# Patient Record
Sex: Male | Born: 1985
Health system: Southern US, Community
[De-identification: ages and names within clinical notes are randomized; demographics above are authoritative.]

## PROBLEM LIST (undated history)

## (undated) DIAGNOSIS — S82402A Unspecified fracture of shaft of left fibula, initial encounter for closed fracture: Secondary | ICD-10-CM

## (undated) DIAGNOSIS — Z8619 Personal history of other infectious and parasitic diseases: Secondary | ICD-10-CM

## (undated) DIAGNOSIS — N200 Calculus of kidney: Secondary | ICD-10-CM

## (undated) HISTORY — DX: Calculus of kidney: N20.0

## (undated) HISTORY — DX: Personal history of other infectious and parasitic diseases: Z86.19

## (undated) HISTORY — DX: Unspecified fracture of shaft of left fibula, initial encounter for closed fracture: S82.402A

---
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
## 2006-04-21 ENCOUNTER — Emergency Department (HOSPITAL_COMMUNITY): Admission: EM | Admit: 2006-04-21 | Discharge: 2006-04-21 | Payer: Self-pay | Admitting: Emergency Medicine

## 2006-04-22 ENCOUNTER — Emergency Department (HOSPITAL_COMMUNITY): Admission: EM | Admit: 2006-04-22 | Discharge: 2006-04-22 | Payer: Self-pay | Admitting: Emergency Medicine

## 2007-05-23 HISTORY — PX: APPENDECTOMY: SHX54

## 2007-10-21 ENCOUNTER — Ambulatory Visit (HOSPITAL_COMMUNITY): Admission: EM | Admit: 2007-10-21 | Discharge: 2007-10-22 | Payer: Self-pay | Admitting: Emergency Medicine

## 2007-10-21 ENCOUNTER — Encounter (INDEPENDENT_AMBULATORY_CARE_PROVIDER_SITE_OTHER): Payer: Self-pay | Admitting: *Deleted

## 2007-11-08 ENCOUNTER — Emergency Department (HOSPITAL_COMMUNITY): Admission: EM | Admit: 2007-11-08 | Discharge: 2007-11-09 | Payer: Self-pay | Admitting: Emergency Medicine

## 2007-11-20 ENCOUNTER — Emergency Department (HOSPITAL_COMMUNITY): Admission: EM | Admit: 2007-11-20 | Discharge: 2007-11-20 | Payer: Self-pay | Admitting: Family Medicine

## 2008-08-13 ENCOUNTER — Emergency Department (HOSPITAL_COMMUNITY): Admission: EM | Admit: 2008-08-13 | Discharge: 2008-08-13 | Payer: Self-pay | Admitting: Emergency Medicine

## 2008-12-19 ENCOUNTER — Emergency Department (HOSPITAL_COMMUNITY): Admission: EM | Admit: 2008-12-19 | Discharge: 2008-12-19 | Payer: Self-pay | Admitting: Emergency Medicine

## 2008-12-29 ENCOUNTER — Emergency Department (HOSPITAL_COMMUNITY): Admission: EM | Admit: 2008-12-29 | Discharge: 2008-12-29 | Payer: Self-pay | Admitting: Family Medicine

## 2009-02-25 ENCOUNTER — Emergency Department: Payer: Self-pay | Admitting: Emergency Medicine

## 2009-06-22 ENCOUNTER — Emergency Department (HOSPITAL_COMMUNITY): Admission: EM | Admit: 2009-06-22 | Discharge: 2009-06-22 | Payer: Self-pay | Admitting: Family Medicine

## 2009-09-04 ENCOUNTER — Emergency Department (HOSPITAL_COMMUNITY): Admission: EM | Admit: 2009-09-04 | Discharge: 2009-09-04 | Payer: Self-pay | Admitting: Emergency Medicine

## 2010-09-01 LAB — POCT URINALYSIS DIP (DEVICE)
Ketones, ur: NEGATIVE mg/dL
Protein, ur: NEGATIVE mg/dL
Specific Gravity, Urine: 1.02 (ref 1.005–1.030)
pH: 7 (ref 5.0–8.0)

## 2010-10-04 NOTE — Op Note (Signed)
NAMEKHALIF, STENDER               ACCOUNT NO.:  [id number]   MEDICAL RECORD NO.:  [id number]          PATIENT TYPE:  OBV   LOCATION:  6045                         FACILITY:  [facility]   PHYSICIAN:  Alfonse Ras, MD   DATE OF BIRTH:  1985/11/08   DATE OF PROCEDURE:  10/21/2007  DATE OF DISCHARGE:                               OPERATIVE REPORT   PREOPERATIVE DIAGNOSIS:  Acute appendicitis.   POSTOPERATIVE DIAGNOSIS:  Acute appendicitis.   PROCEDURE:  Laparoscopic appendectomy.   ANESTHESIA:  General.   SURGEON:  Alfonse Ras, MD.   DESCRIPTION:  The patient was taken to the operating room, placed in  supine position after adequate general anesthesia was induced using  endotracheal tube, Foley catheter was placed and the abdomen was prepped  and draped in normal sterile fashion using an OptiVu trocar in the left  upper quadrant, I obtained peritoneal access under direct vision.  Pneumoperitoneum was obtained.  Additional 12 mm trocar was placed in  the left lower quadrant and 5 mm trocar was placed in left abdomen.  A  separate of appendix was easily identified coming off the cecum.  The  mesoappendix was taken down with a harmonic scalpel.  The base of the  appendix was identified and transected using a GIA stapling device.  The  appendix was placed in EndoCatch bag and removed with the left lower  quadrant port.  The right lower quadrant was copiously irrigated.  Adequate hemostasis was ensured.  Pneumoperitoneum was released.  Trocars were removed.  Skin incisions were closed with subcuticular 4-0  Monocryl.  Steri-Strips and sterile dressings were applied.  The patient  tolerated the procedure well and went to PACU in good condition.      Alfonse Ras, MD  Electronically Signed     KRE/MEDQ  D:  10/21/2007  T:  10/22/2007  Job:  [id number]

## 2010-10-04 NOTE — H&P (Signed)
NAMEMONIQUE, GIFT NO.:  [id number]   MEDICAL RECORD NO.:  [id number]          PATIENT TYPE:  EMS   LOCATION:  ED                           FACILITY:  [facility]   PHYSICIAN:  Alfonse Ras, MD   DATE OF BIRTH:  1986-05-09   DATE OF ADMISSION:  10/21/2007  DATE OF DISCHARGE:                              HISTORY & PHYSICAL   ADMISSION DIAGNOSIS:  Acute appendicitis.   HISTORY OF PRESENT ILLNESS:  The patient is a very pleasant 25 year old  white male with about 24-hour history of worsening lower abdominal pain,  localized to the right lower quadrant.  He was worked up in the  emergency room here at [facility], which showed CT scan  consistent with acute appendicitis.  His urinalysis was normal.  His  white blood cell count was 15.1 with a slight left shift.  The patient  has no past medical history.   SOCIAL HISTORY:  Positive only for tobacco use.  He works as a Cabin crew.   ALLERGIES:  He has no known drug allergies.   REVIEW OF SYSTEMS:  Significant only as above.   PHYSICAL EXAMINATION:  GENERAL:  He is an age-appropriate white male in  no distress.  VITAL SIGNS:  His temperature is 99.8.  His heart rate is 87,  respiratory rate is 18, and blood pressure is 116/57.  HEENT:  Benign.  Normocephalic and atraumatic.  Pupils are equal, round,  and reactive to light.  NECK:  Supple and soft without thyromegaly or cervical adenopathy.  LUNGS:  Clear to auscultation and percussion x2.  HEART:  Regular rate and rhythm without murmurs, rubs, or gallops.  ABDOMEN:  Soft, but tender in the right lower quadrant with positive  rebound.  EXTREMITIES:  Show no clubbing, cyanosis, or edema.  SKIN:  Examination of the skin shows no indurations, lesions, rashes, or  ulceration.   IMPRESSION:  Acute appendicitis.   PLAN:  Laparoscopic appendectomy.   I discussed the risks of bleeding, infection, wound breakdown, and  conversion to open with the  patient.  His mother and his girlfriend,  they wished to proceed.  All their questions have been answered.      Alfonse Ras, MD  Electronically Signed     KRE/MEDQ  D:  10/21/2007  T:  10/22/2007  Job:  [id number]

## 2011-02-16 LAB — SAMPLE TO BLOOD BANK

## 2011-02-16 LAB — COMPREHENSIVE METABOLIC PANEL
Albumin: 3.8
Alkaline Phosphatase: 62
BUN: 10
Chloride: 104
Glucose, Bld: 90
Potassium: 3.5
Total Bilirubin: 1.3 — ABNORMAL HIGH

## 2011-02-16 LAB — URINALYSIS, ROUTINE W REFLEX MICROSCOPIC
Bilirubin Urine: NEGATIVE
Bilirubin Urine: NEGATIVE
Glucose, UA: NEGATIVE
Hgb urine dipstick: NEGATIVE
Ketones, ur: NEGATIVE
Nitrite: NEGATIVE
Nitrite: NEGATIVE
Protein, ur: 30 — AB
Protein, ur: NEGATIVE
Specific Gravity, Urine: 1.022
Specific Gravity, Urine: 1.015
Urobilinogen, UA: 1
Urobilinogen, UA: 1
pH: 7

## 2011-02-16 LAB — CBC
HCT: 38.8 — ABNORMAL LOW
Hemoglobin: 13.5
MCHC: 34.8
MCV: 94.4
Platelets: 255
RBC: 4.11 — ABNORMAL LOW
RDW: 12.8
WBC: 15.1 — ABNORMAL HIGH

## 2011-02-16 LAB — COMPREHENSIVE METABOLIC PANEL WITH GFR
ALT: 13
AST: 19
CO2: 27
Calcium: 9.4
Creatinine, Ser: 0.95
GFR calc Af Amer: >60
GFR calc non Af Amer: >60
Sodium: 138
Total Protein: 6.9

## 2011-02-16 LAB — POCT I-STAT 3, VENOUS BLOOD GAS (G3P V)
Acid-Base Excess: 6 — ABNORMAL HIGH
O2 Saturation: 68

## 2011-02-16 LAB — POCT I-STAT, CHEM 8
BUN: 15
Calcium, Ion: 0.88 — ABNORMAL LOW
Calcium, Ion: 0.98 — ABNORMAL LOW
Calcium, Ion: 1.27
Calcium, Ion: 1.24
Chloride: 104
Creatinine, Ser: 1.2
Creatinine, Ser: 1.2
Glucose, Bld: 473 — ABNORMAL HIGH
Glucose, Bld: 86
Glucose, Bld: 90
HCT: 38 — ABNORMAL LOW
HCT: 41
Hemoglobin: 12.9 — ABNORMAL LOW
Hemoglobin: 12.9 — ABNORMAL LOW
Hemoglobin: 14.3
Potassium: 3.9
Sodium: 136
Sodium: 141
TCO2: 28
TCO2: 28
TCO2: 30

## 2011-02-16 LAB — BASIC METABOLIC PANEL
Calcium: 10.1
Creatinine, Ser: 0.99
GFR calc Af Amer: >60
GFR calc non Af Amer: >60
Glucose, Bld: 85
Sodium: 142

## 2011-02-16 LAB — URINE CULTURE: Culture: NO GROWTH

## 2011-02-16 LAB — URINE MICROSCOPIC-ADD ON

## 2011-02-16 LAB — POCT URINALYSIS DIP (DEVICE)
Ketones, ur: NEGATIVE
Nitrite: NEGATIVE
Operator id: 247071
Protein, ur: >=300 — AB
Urobilinogen, UA: 0.2
pH: 7

## 2011-02-16 LAB — DIFFERENTIAL
Basophils Absolute: 0
Basophils Relative: 0
Eosinophils Absolute: 0
Eosinophils Relative: 0
Lymphocytes Relative: 15
Lymphs Abs: 2.3
Monocytes Absolute: 0.7
Monocytes Relative: 5
Neutro Abs: 12 — ABNORMAL HIGH
Neutrophils Relative %: 79 — ABNORMAL HIGH

## 2011-03-23 IMAGING — CR DG HAND COMPLETE 3+V*R*
3 series · 3 of 3 positions shown · non-contrast
Comparison: None.

CLINICAL DATA: Blunt injury.  Swelling and bruising over fifth
metacarpal.

RIGHT HAND - COMPLETE 3+ VIEW

[view not recorded (1 of 3)]
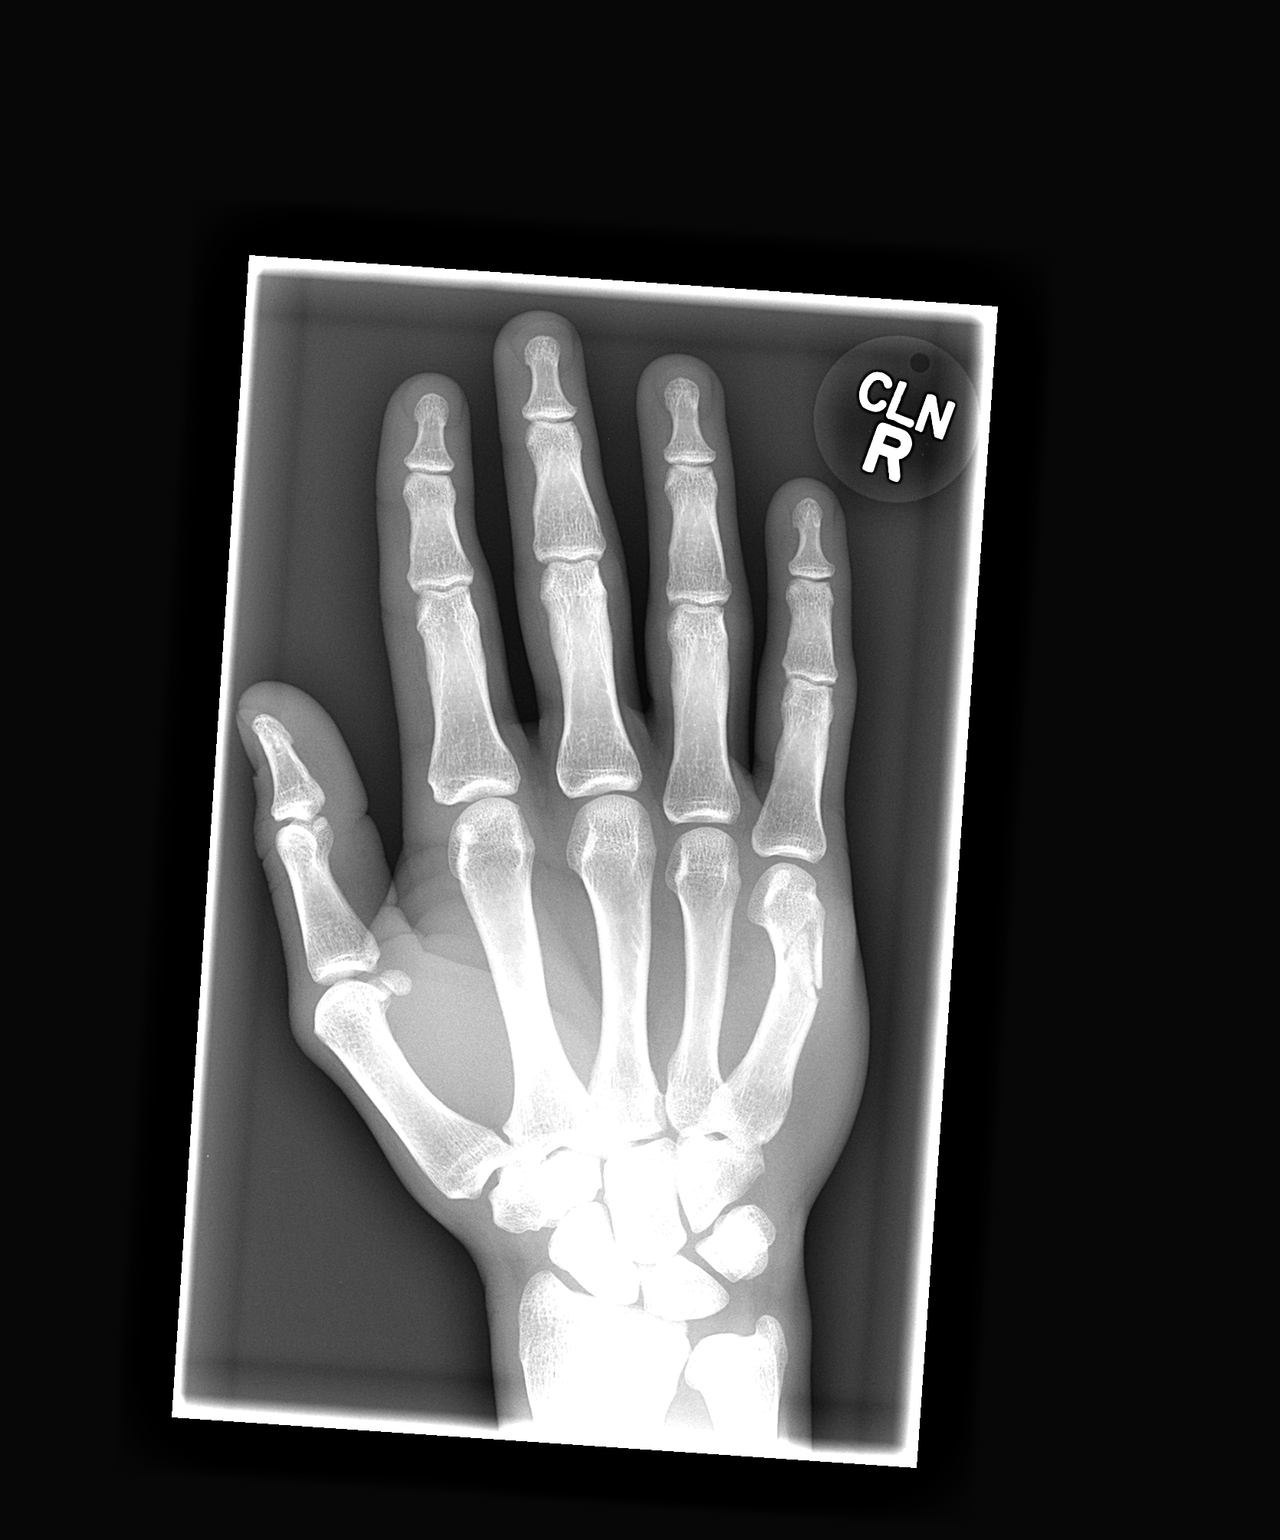

[view not recorded (2 of 3)]
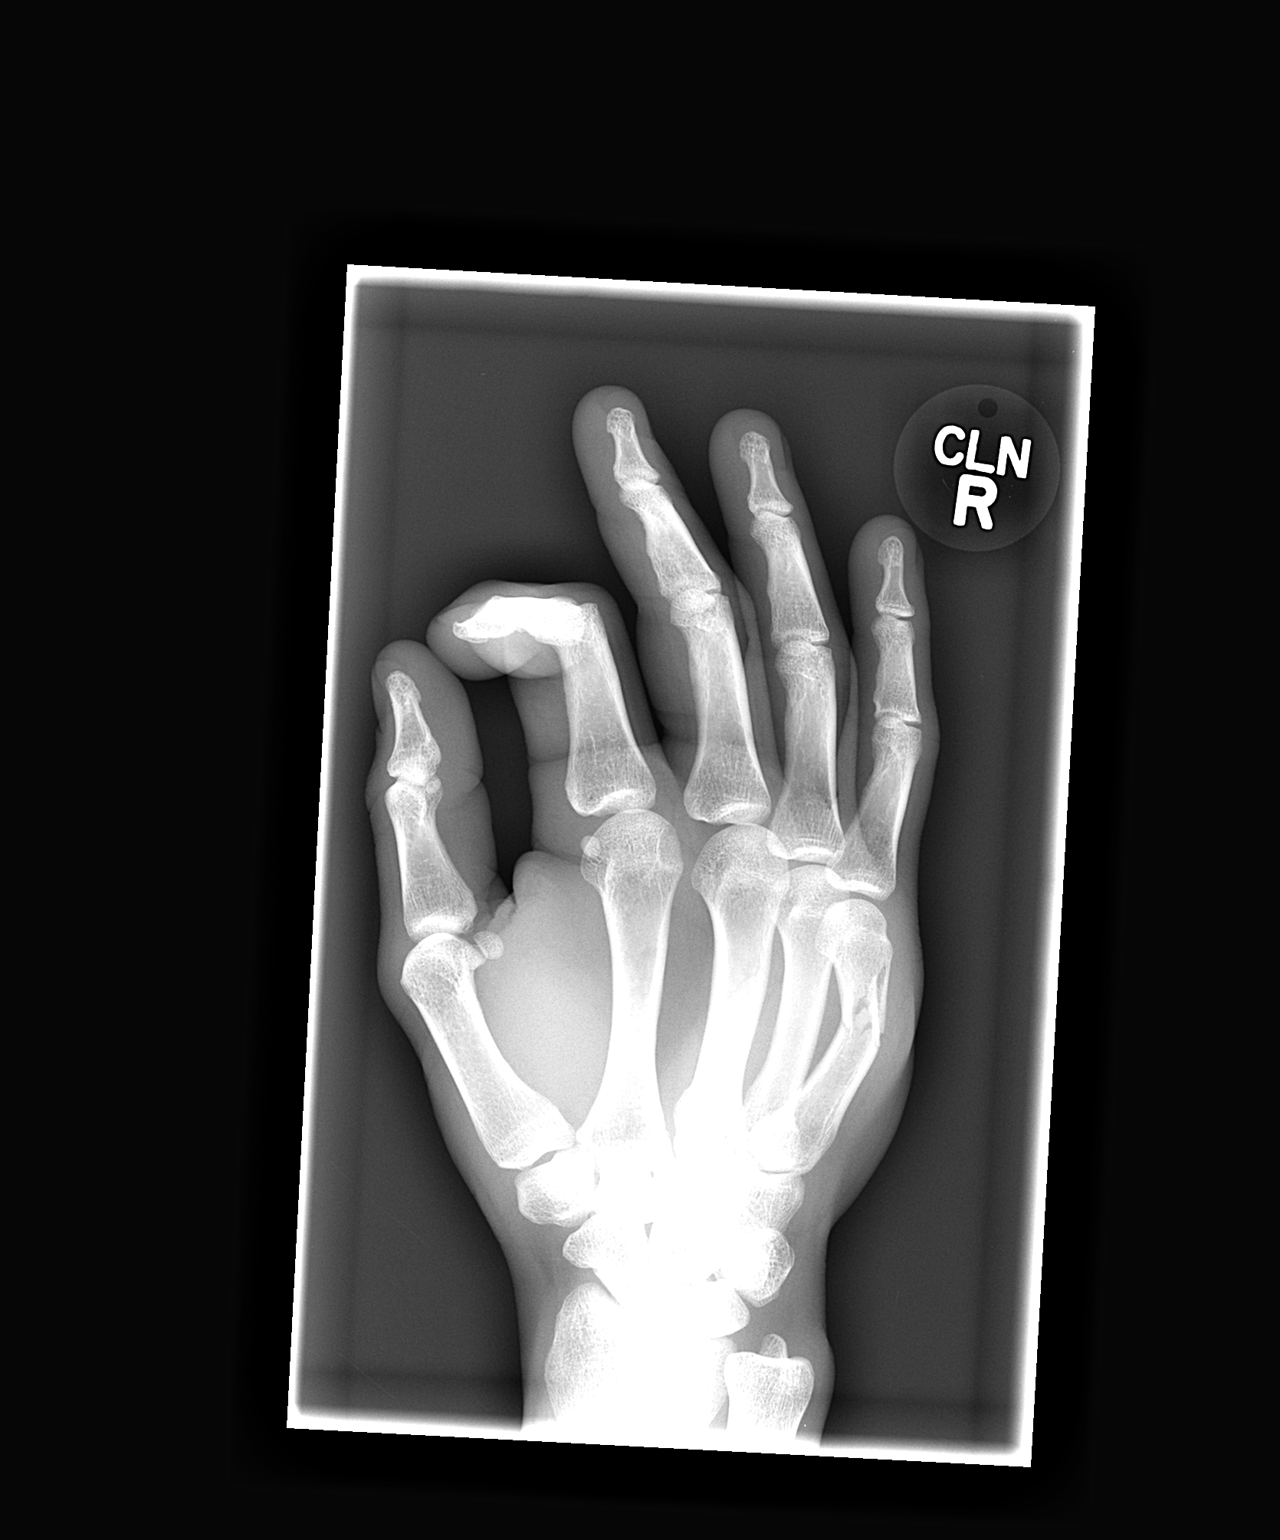

[view not recorded (3 of 3)]
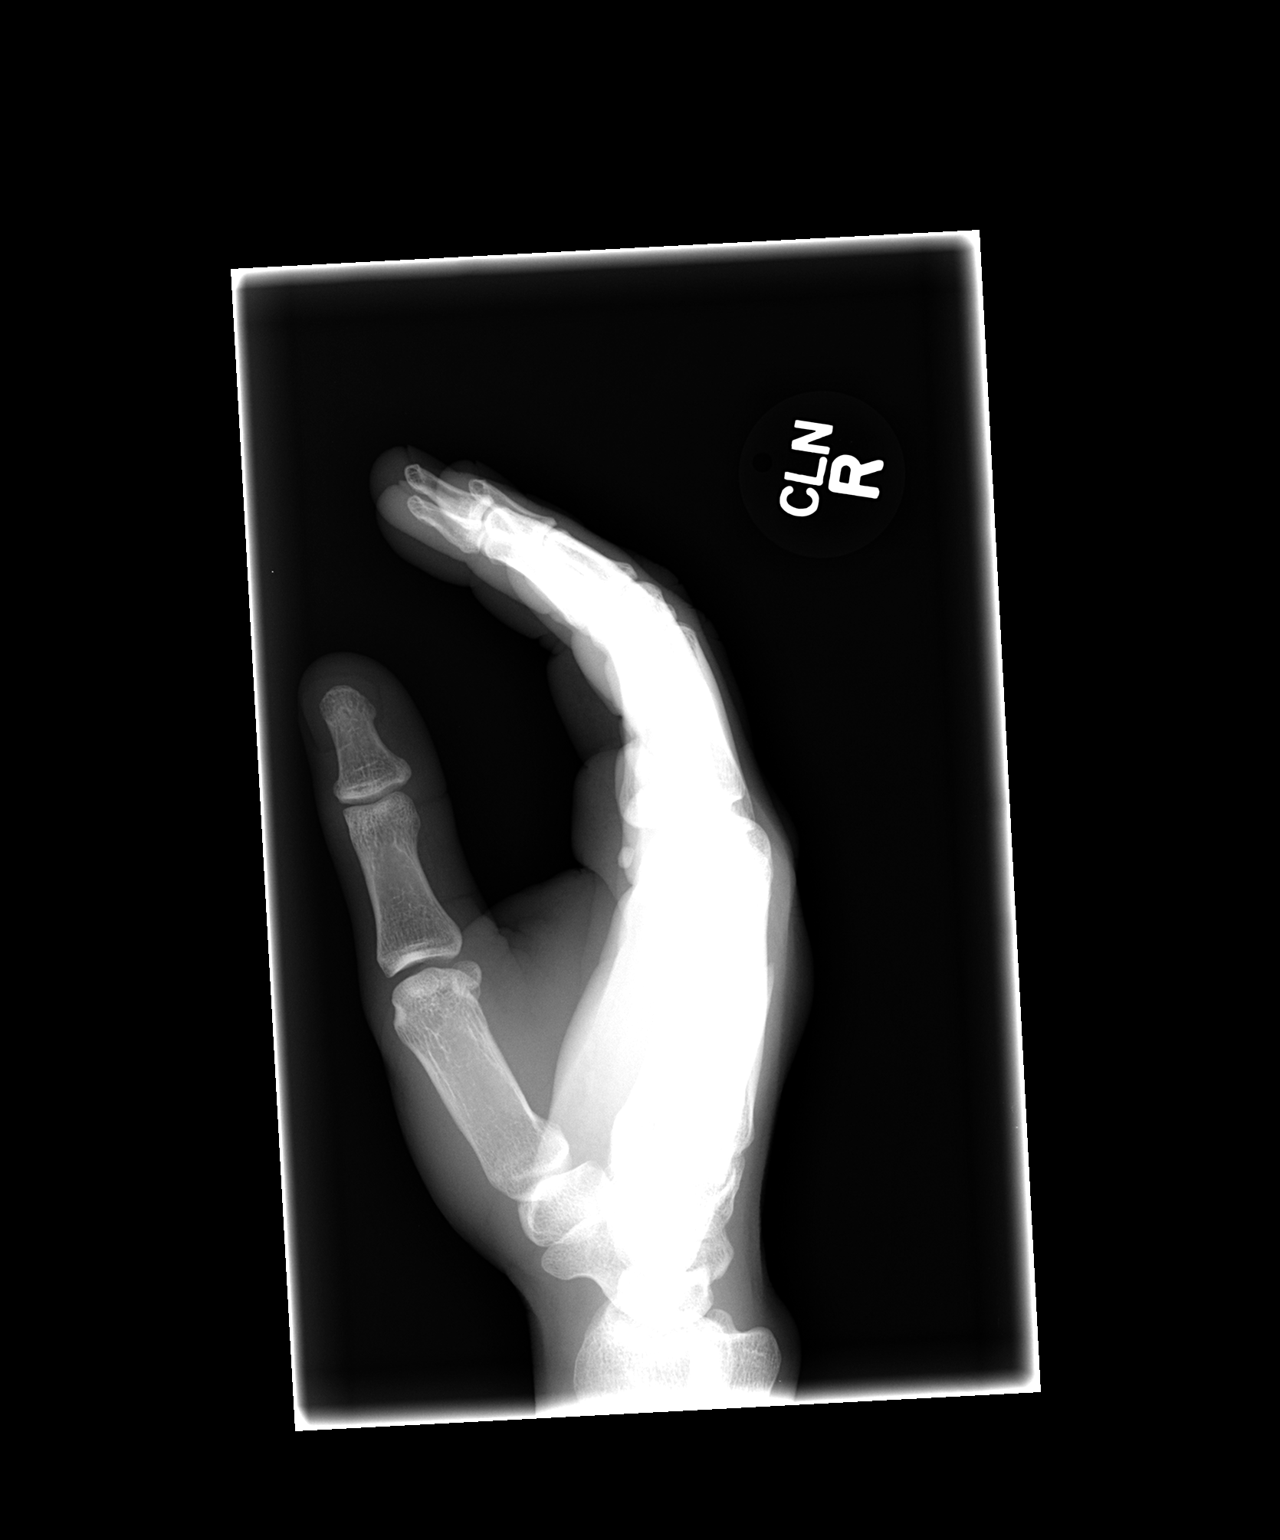

[3 of 3 positions shown; findings below may reference images not displayed]

FINDINGS: No acute comminuted fracture of the mid and distal
aspects of the fifth metacarpal shaft.  This fracture is just
proximal to the fifth metacarpal neck.  Mild angulation at the
fracture site.
IMPRESSION: Acute right fifth metacarpal fracture.

## 2012-05-03 ENCOUNTER — Ambulatory Visit (INDEPENDENT_AMBULATORY_CARE_PROVIDER_SITE_OTHER): Payer: 59 | Admitting: Family Medicine

## 2012-05-03 ENCOUNTER — Encounter: Payer: Self-pay | Admitting: Family Medicine

## 2012-05-03 VITALS — BP 118/72 | HR 51 | Temp 97.8°F | Wt 147.8 lb

## 2012-05-03 DIAGNOSIS — Z113 Encounter for screening for infections with a predominantly sexual mode of transmission: Secondary | ICD-10-CM

## 2012-05-03 DIAGNOSIS — Z0000 Encounter for general adult medical examination without abnormal findings: Secondary | ICD-10-CM | POA: Insufficient documentation

## 2012-05-03 DIAGNOSIS — Z23 Encounter for immunization: Secondary | ICD-10-CM

## 2012-05-03 DIAGNOSIS — IMO0001 Reserved for inherently not codable concepts without codable children: Secondary | ICD-10-CM

## 2012-05-03 DIAGNOSIS — F17200 Nicotine dependence, unspecified, uncomplicated: Secondary | ICD-10-CM

## 2012-05-03 MED ORDER — VARENICLINE TARTRATE 0.5 MG X 11 & 1 MG X 42 PO MISC: ORAL | Status: DC

## 2012-05-03 MED ORDER — VARENICLINE TARTRATE 1 MG PO TABS: 1.0000 mg | ORAL_TABLET | Freq: Two times a day (BID) | ORAL | Status: DC

## 2012-05-03 NOTE — Assessment & Plan Note (Signed)
Discussed different forms of smoking cessation, encouraged cessation. Pt requests trial of chantix - discussed common side effects including but not limited to nausea/vomiting, vivid dreams, behaviour changes, discussed possible CV risk association. 

## 2012-05-03 NOTE — Patient Instructions (Signed)
Let's try chantix - price it out.  I've sent it to the pharmacy.  Starting month pack and continuing month pack.  Watch for nausea, mood changes, vivid dreams, if not tolerating well, stop and let me know. Return if you'd like to further discuss smoking 1 month after starting chantix. Tetanus pertussis (Tdap) and flu shot today. Return at your convenience fasting for blood work. Good to see you today, return as needed or in 2 years for next physical.

## 2012-05-03 NOTE — Progress Notes (Signed)
Subjective:    Patient ID: Phillip Randolph, male    DOB: 1985/07/05, 26 y.o.   MRN: [id number]  HPI CC: new pt to establish, would like CPE.  No questions/concerns today  Smoking - 1 ppd for 12 yrs.  Interested in quitting.  Has tried cold [place] in past, failed.  GF smokes.  Sexually active - with GF, 1 partner in last year.  Has never been tested for STDs.  Would like testing.  Preventative: No recent CPE. Drank mountain dew. Flu shot - requests today Tetanus - thinks done 2009.  Around 4 mo child.  Seat belt use discussed Sunscreen use discussed.  Caffeine: 6 mountain dews daily Lives with GF and 2 children (2010, 2013), 2 dogs Occupation: Aeronautical engineer Activity: stays active at work, sometimes plays sports Diet: some water, fruits/vegetables some  Medications and allergies reviewed and updated in chart.  Past histories reviewed and updated if relevant as below. There is no problem list on file for this patient.  Past Medical History  Diagnosis Date  . History of chicken pox   . Kidney stones 2000   Past Surgical History  Procedure Date  . Appendectomy 2009   History  Substance Use Topics  . Smoking status: Current Every Day Smoker -- 1.0 packs/day for 12 years    Types: Cigarettes  . Smokeless tobacco: Never Used  . Alcohol Use: Yes     Comment: social   Family History  Problem Relation Age of Onset  . Alcohol abuse Maternal Uncle   . Seizures Maternal Uncle   . CAD Neg Hx   . Stroke Neg Hx   . Diabetes Neg Hx   . Cancer Neg Hx    No Known Allergies No current outpatient prescriptions on file prior to visit.     Review of Systems  Constitutional: Negative for fever, chills, activity change, appetite change, fatigue and unexpected weight change.  HENT: Negative for hearing loss and neck pain.   Eyes: Negative for visual disturbance.  Respiratory: Negative for cough, chest tightness, shortness of breath and wheezing.   Cardiovascular: Positive for  palpitations (occasional - attributed to caffeine). Negative for chest pain and leg swelling.  Gastrointestinal: Negative for nausea, vomiting, abdominal pain, diarrhea, constipation, blood in stool and abdominal distention.  Genitourinary: Negative for hematuria and difficulty urinating.  Musculoskeletal: Negative for myalgias and arthralgias.  Skin: Negative for rash.  Neurological: Positive for headaches (mild). Negative for dizziness, seizures and syncope.  Psychiatric/Behavioral: Negative for dysphoric mood. The patient is not nervous/anxious.        Objective:   Physical Exam  Nursing note and vitals reviewed. Constitutional: He is oriented to person, place, and time. He appears well-developed and well-nourished. No distress.  HENT:  Head: Normocephalic and atraumatic.  Right Ear: Hearing, tympanic membrane, external ear and ear canal normal.  Left Ear: Hearing, tympanic membrane, external ear and ear canal normal.  Nose: Nose normal.  Mouth/Throat: Oropharynx is clear and moist. No oropharyngeal exudate.  Eyes: Conjunctivae normal and EOM are normal. Pupils are equal, round, and reactive to light. No scleral icterus.  Neck: Normal range of motion. Neck supple. No thyromegaly present.  Cardiovascular: Normal rate, regular rhythm, normal heart sounds and intact distal pulses.   No murmur heard. Pulses:      Radial pulses are 2+ on the right side, and 2+ on the left side.  Pulmonary/Chest: Effort normal and breath sounds normal. No respiratory distress. He has no wheezes. He has no rales.  Abdominal: Soft. Bowel sounds are normal. He exhibits no distension and no mass. There is no tenderness. There is no rebound and no guarding.  Musculoskeletal: Normal range of motion. He exhibits no edema.  Lymphadenopathy:    He has no cervical adenopathy.  Neurological: He is alert and oriented to person, place, and time.       CN grossly intact, station and gait intact  Skin: Skin is warm  and dry. No rash noted.  Psychiatric: He has a normal mood and affect. His behavior is normal. Judgment and thought content normal.      Assessment & Plan:

## 2012-05-03 NOTE — Assessment & Plan Note (Signed)
Preventative protocols reviewed and updated unless pt declined. Discussed healthy diet and lifestyle.  Will return fasting for blood work and STD check.

## 2012-05-03 NOTE — Addendum Note (Signed)
Addended by: Criselda Peaches B on: 05/03/2012 10:51 AM   Modules accepted: Orders

## 2012-05-10 ENCOUNTER — Other Ambulatory Visit (INDEPENDENT_AMBULATORY_CARE_PROVIDER_SITE_OTHER): Payer: 59

## 2012-05-10 DIAGNOSIS — Z113 Encounter for screening for infections with a predominantly sexual mode of transmission: Secondary | ICD-10-CM

## 2012-05-10 DIAGNOSIS — Z0000 Encounter for general adult medical examination without abnormal findings: Secondary | ICD-10-CM

## 2012-05-10 LAB — BASIC METABOLIC PANEL
CO2: 29 mEq/L (ref 19–32)
Chloride: 104 mEq/L (ref 96–112)
Creatinine, Ser: 0.8 mg/dL (ref 0.4–1.5)
Sodium: 138 mEq/L (ref 135–145)

## 2012-05-10 LAB — LIPID PANEL
HDL: 51.5 mg/dL (ref >39.00)
LDL Cholesterol: 85 mg/dL (ref 0–99)
VLDL: 9 mg/dL (ref 0.0–40.0)

## 2012-05-11 LAB — GC/CHLAMYDIA PROBE AMP, URINE
Chlamydia, Swab/Urine, PCR: NEGATIVE
GC Probe Amp, Urine: NEGATIVE

## 2012-05-11 LAB — RPR

## 2012-05-16 ENCOUNTER — Encounter: Payer: 59 | Admitting: Family Medicine

## 2012-06-03 ENCOUNTER — Ambulatory Visit: Payer: 59 | Admitting: Family Medicine

## 2012-06-03 DIAGNOSIS — Z0289 Encounter for other administrative examinations: Secondary | ICD-10-CM

## 2014-01-09 ENCOUNTER — Emergency Department: Payer: Self-pay | Admitting: Emergency Medicine

## 2015-08-31 ENCOUNTER — Emergency Department (HOSPITAL_COMMUNITY)
Admission: EM | Admit: 2015-08-31 | Discharge: 2015-08-31 | Disposition: A | Discharge status: Home / Self Care | Payer: 59 | Attending: Emergency Medicine | Admitting: Emergency Medicine

## 2015-08-31 ENCOUNTER — Encounter (HOSPITAL_COMMUNITY): Payer: Self-pay | Admitting: Emergency Medicine

## 2015-08-31 DIAGNOSIS — F17210 Nicotine dependence, cigarettes, uncomplicated: Secondary | ICD-10-CM | POA: Insufficient documentation

## 2015-08-31 DIAGNOSIS — M79602 Pain in left arm: Secondary | ICD-10-CM | POA: Diagnosis present

## 2015-08-31 DIAGNOSIS — R591 Generalized enlarged lymph nodes: Secondary | ICD-10-CM

## 2015-08-31 DIAGNOSIS — L03114 Cellulitis of left upper limb: Secondary | ICD-10-CM | POA: Diagnosis not present

## 2015-08-31 DIAGNOSIS — Z8619 Personal history of other infectious and parasitic diseases: Secondary | ICD-10-CM | POA: Insufficient documentation

## 2015-08-31 DIAGNOSIS — Z79899 Other long term (current) drug therapy: Secondary | ICD-10-CM | POA: Diagnosis not present

## 2015-08-31 DIAGNOSIS — R590 Localized enlarged lymph nodes: Secondary | ICD-10-CM | POA: Diagnosis not present

## 2015-08-31 DIAGNOSIS — Z87442 Personal history of urinary calculi: Secondary | ICD-10-CM | POA: Insufficient documentation

## 2015-08-31 MED ORDER — CEPHALEXIN 500 MG PO CAPS: 500.0000 mg | ORAL_CAPSULE | Freq: Four times a day (QID) | ORAL | Status: DC

## 2015-08-31 NOTE — Discharge Instructions (Signed)
Cellulitis Cellulitis is an infection of the skin and the tissue beneath it. The infected area is usually red and tender. Cellulitis occurs most often in the arms and lower legs.  CAUSES  Cellulitis is caused by bacteria that enter the skin through cracks or cuts in the skin. The most common types of bacteria that cause cellulitis are staphylococci and streptococci. SIGNS AND SYMPTOMS   Redness and warmth.  Swelling.  Tenderness or pain.  Fever. DIAGNOSIS  Your health care provider can usually determine what is wrong based on a physical exam. Blood tests may also be done. TREATMENT  Treatment usually involves taking an antibiotic medicine. HOME CARE INSTRUCTIONS   Take your antibiotic medicine as directed by your health care provider. Finish the antibiotic even if you start to feel better.  Keep the infected arm or leg elevated to reduce swelling.  Apply a warm cloth to the affected area up to 4 times per day to relieve pain.  Take medicines only as directed by your health care provider.  Keep all follow-up visits as directed by your health care provider. SEEK MEDICAL CARE IF:   You notice red streaks coming from the infected area.  Your red area gets larger or turns dark in color.  Your bone or joint underneath the infected area becomes painful after the skin has healed.  Your infection returns in the same area or another area.  You notice a swollen bump in the infected area.  You develop new symptoms.  You have a fever. SEEK IMMEDIATE MEDICAL CARE IF:   You feel very sleepy.  You develop vomiting or diarrhea.  You have a general ill feeling (malaise) with muscle aches and pains.   This information is not intended to replace advice given to you by your health care provider. Make sure you discuss any questions you have with your health care provider.   Document Released: 02/15/2005 Document Revised: 01/27/2015 Document Reviewed: 07/24/2011 Elsevier Interactive  Patient Education 2016 Elsevier Inc.  Lymphadenopathy Lymphadenopathy refers to swollen or enlarged lymph glands, also called lymph nodes. Lymph glands are part of your body's defense (immune) system, which protects the body from infections, germs, and diseases. Lymph glands are found in many locations in your body, including the neck, underarm, and groin.  Many things can cause lymph glands to become enlarged. When your immune system responds to germs, such as viruses or bacteria, infection-fighting cells and fluid build up. This causes the glands to grow in size. Usually, this is not something to worry about. The swelling and any soreness often go away without treatment. However, swollen lymph glands can also be caused by a number of diseases. Your health care provider may do various tests to help determine the cause. If the cause of your swollen lymph glands cannot be found, it is important to monitor your condition to make sure the swelling goes away. HOME CARE INSTRUCTIONS Watch your condition for any changes. The following actions may help to lessen any discomfort you are feeling:  Get plenty of rest.  Take medicines only as directed by your health care provider. Your health care provider may recommend over-the-counter medicines for pain.  Apply moist heat compresses to the site of swollen lymph nodes as directed by your health care provider. This can help reduce any pain.  Check your lymph nodes daily for any changes.  Keep all follow-up visits as directed by your health care provider. This is important. SEEK MEDICAL CARE IF:  Your lymph nodes  are still swollen after 2 weeks.  Your swelling increases or spreads to other areas.  Your lymph nodes are hard, seem fixed to the skin, or are growing rapidly.  Your skin over the lymph nodes is red and inflamed.  You have a fever.  You have chills.  You have fatigue.  You develop a sore throat.  You have abdominal pain.  You have  weight loss.  You have night sweats. SEEK IMMEDIATE MEDICAL CARE IF:  You notice fluid leaking from the area of the enlarged lymph node.  You have severe pain in any area of your body.  You have chest pain.  You have shortness of breath.

## 2015-08-31 NOTE — ED Notes (Signed)
Pt st's he had a cut on his arm and accidentally knocked the scab off and now he thinks his arm is infected.  Pt c/o pain to left forearm

## 2015-08-31 NOTE — ED Provider Notes (Signed)
History  By signing my name below, I, Karle Plumber, attest that this documentation has been prepared under the direction and in the presence of Rob Brighton, [place]. Electronically Signed: Karle Plumber, ED Scribe. 08/31/2015. 10:23 PM.  Chief Complaint  Patient presents with  . Arm Pain   The history is provided by the patient and medical records. No language interpreter was used.    HPI Comments:  Phillip Randolph. is a 30 y.o. male who presents to the Emergency Department complaining of a small laceration to the RUE that he sustained a few days ago. He reports some purulent drainage and states he accidentally removed the scab. Pt is concerned for infection. He has not done anything for treatment of the wounds. He denies modifying factors. He denies fever, chills, nausea or vomiting.   Past Medical History  Diagnosis Date  . History of chicken pox   . Kidney stones 2000   Past Surgical History  Procedure Laterality Date  . Appendectomy  2009   Family History  Problem Relation Age of Onset  . Alcohol abuse Maternal Uncle   . Seizures Maternal Uncle   . CAD Neg Hx   . Stroke Neg Hx   . Diabetes Neg Hx   . Cancer Neg Hx    Social History  Substance Use Topics  . Smoking status: Current Every Day Smoker -- 1.00 packs/day for 12 years    Types: Cigarettes  . Smokeless tobacco: Never Used  . Alcohol Use: Yes     Comment: social    Review of Systems  Constitutional: Negative for fever and chills.  Gastrointestinal: Negative for nausea and vomiting.  Skin: Positive for wound.   Allergies  Review of patient's allergies indicates no known allergies.  Home Medications   Prior to Admission medications   Medication Sig Start Date End Date Taking? Authorizing Provider  varenicline (CHANTIX CONTINUING MONTH PAK) 1 MG tablet Take 1 tablet (1 mg total) by mouth 2 (two) times daily. 05/03/12   Eustaquio Boyden, MD  varenicline (CHANTIX STARTING MONTH PAK) 0.5 MG X 11 & 1  MG X 42 tablet Take one 0.5 mg tablet by mouth once daily for 3 days, then increase to one 0.5 mg tablet twice daily for 4 days, then increase to one 1 mg tablet twice daily. 05/03/12   Eustaquio Boyden, MD   Triage Vitals: BP 139/69 mmHg  Pulse 63  Temp(Src) 97.9 F (36.6 C) (Oral)  Resp 18  Ht  (1.778 m)  Wt 147 lb 6 oz (66.849 kg)  BMI 21.15 kg/m2  SpO2 98% Physical Exam Physical Exam  Constitutional: Pt appears well-developed and well-nourished. No distress.  Awake, alert, nontoxic appearance  HENT:  Head: Normocephalic and atraumatic.  Mouth/Throat: Oropharynx is clear and moist. No oropharyngeal exudate.  Eyes: Conjunctivae are normal. No scleral icterus.  Neck: Normal range of motion. Neck supple.  Cardiovascular: Normal rate, regular rhythm and intact distal pulses.   Pulmonary/Chest: Effort normal and breath sounds normal. No respiratory distress. Pt has no wheezes.  Equal chest expansion  Abdominal: Soft. Bowel sounds are normal. Pt exhibits no mass. There is no tenderness. There is no rebound and no guarding.  Lymph: Left brachial lymph node tender to palpation. No abscess. Musculoskeletal: Normal range of motion. Pt exhibits no edema.  Neurological: Pt is alert.  Speech is clear and goal oriented Moves extremities without ataxia  Skin: Skin is warm and dry. Pt is not diaphoretic. Quarter sized area of cellulitis  to left anterior distal forearm. No abscess. Psychiatric: Pt has a normal mood and affect.  Nursing note and vitals reviewed.   ED Course  Procedures (including critical care time) DIAGNOSTIC STUDIES: Oxygen Saturation is 98% on RA, normal by my interpretation.   COORDINATION OF CARE: 10:21 PM- Will prescribe antibiotic. Pt verbalizes understanding and agrees to plan.  Medications - No data to display   MDM   Final diagnoses:  Cellulitis of left upper extremity  Lymphadenopathy    Small cellulitis vs recent ruptured pustule/abscess to  posterior left forearm.  Well healing now.  Has some reactive lymph nodes in left brachial region.  Will treat with keflex.  Recommend PCP follow-up.   I personally performed the services described in this documentation, which was scribed in my presence. The recorded information has been reviewed and is accurate.       Roxy Horsemanobert Abraham Entwistle, PA-C 08/31/15 2232  Lavera Guiseana Duo Liu, MD 09/01/15 1116

## 2016-03-13 ENCOUNTER — Ambulatory Visit (INDEPENDENT_AMBULATORY_CARE_PROVIDER_SITE_OTHER): Payer: 59 | Admitting: Primary Care

## 2016-03-13 ENCOUNTER — Encounter: Payer: Self-pay | Admitting: Primary Care

## 2016-03-13 VITALS — BP 124/82 | HR 58 | Temp 98.2°F | Ht 68.5 in | Wt 149.4 lb

## 2016-03-13 DIAGNOSIS — Z0000 Encounter for general adult medical examination without abnormal findings: Secondary | ICD-10-CM | POA: Diagnosis not present

## 2016-03-13 NOTE — Assessment & Plan Note (Signed)
Tetanus up-to-date, influenza vaccination provided today. Discussed the importance of a healthy diet and regular exercise in order to reduce the risk of other medical diseases. Exam unremarkable. Labs pending. Follow-up in one year for repeat physical.

## 2016-03-13 NOTE — Progress Notes (Signed)
Subjective:    Patient ID: Phillip Randolph., male    DOB: 1985/07/20, 30 y.o.   MRN: [id number]  HPI  Phillip Randolph is a 30 year old male who presents today for complete physical.  Immunizations: -Tetanus: Completed in 2013 -Influenza: Due.   Diet: He endorses a healthy diet Breakfast: Eggs, bacon Lunch: Fast food Dinner: Pasta, fast food, cheese burgers, pork, salad Snacks: Chips Desserts: Occasionally Beverages: Water, [employer], sweet tea  Exercise: He is active with his occupation. Eye exam: Completed several year ago. Dental exam: Completed several years ago   Review of Systems  Constitutional: Negative for unexpected weight change.  HENT: Negative for rhinorrhea.   Respiratory: Negative for cough and shortness of breath.   Cardiovascular: Negative for chest pain.  Gastrointestinal: Negative for constipation and diarrhea.  Genitourinary: Negative for difficulty urinating.  Musculoskeletal: Negative for arthralgias and myalgias.       Intermittent low back pain  Skin: Negative for rash.  Allergic/Immunologic: Negative for environmental allergies.  Neurological: Negative for dizziness, numbness and headaches.  Psychiatric/Behavioral:       Denies concerns for anxiety and depression        Past Medical History:  Diagnosis Date  . History of chicken pox   . Kidney stones 2000     Social History   Social History  . Marital status: Married    Spouse name: N/A  . Number of children: N/A  . Years of education: N/A   Occupational History  . Not on file.   Social History Main Topics  . Smoking status: Current Every Day Smoker    Packs/day: 0.50    Years: 12.00    Types: Cigarettes  . Smokeless tobacco: Never Used  . Alcohol use Yes     Comment: social  . Drug use:     Frequency: 2.0 times per week    Types: Marijuana     Comment: MJ  . Sexual activity: Not on file   Other Topics Concern  . Not on file   Social History Narrative   Married.   4 children.   Works in Aeronautical engineer.   Enjoys spending time with family.    Past Surgical History:  Procedure Laterality Date  . APPENDECTOMY  2009    Family History  Problem Relation Age of Onset  . Alcohol abuse Maternal Uncle   . Seizures Maternal Uncle   . CAD Neg Hx   . Stroke Neg Hx   . Diabetes Neg Hx   . Cancer Neg Hx     No Known Allergies  No current outpatient prescriptions on file prior to visit.   No current facility-administered medications on file prior to visit.     BP 124/82   Pulse (!) 58   Temp 98.2 F (36.8 C) (Oral)   Ht 5' 8.5" (1.74 m)   Wt 149 lb 6.4 oz (67.8 kg)   SpO2 98%   BMI 22.39 kg/m    Objective:   Physical Exam  Constitutional: He is oriented to person, place, and time. He appears well-nourished.  HENT:  Right Ear: Tympanic membrane and ear canal normal.  Left Ear: Tympanic membrane and ear canal normal.  Nose: Nose normal. Right sinus exhibits no maxillary sinus tenderness and no frontal sinus tenderness. Left sinus exhibits no maxillary sinus tenderness and no frontal sinus tenderness.  Mouth/Throat: Oropharynx is clear and moist.  Eyes: Conjunctivae and EOM are normal. Pupils are equal, round, and reactive to  light.  Neck: Neck supple. Carotid bruit is not present. No thyromegaly present.  Cardiovascular: Normal rate, regular rhythm and normal heart sounds.   Pulmonary/Chest: Effort normal and breath sounds normal. He has no wheezes. He has no rales.  Abdominal: Soft. Bowel sounds are normal. There is no tenderness.  Musculoskeletal: Normal range of motion.  Neurological: He is alert and oriented to person, place, and time. He has normal reflexes. No cranial nerve deficit.  Skin: Skin is warm and dry.  Psychiatric: He has a normal mood and affect.          Assessment & Plan:

## 2016-03-13 NOTE — Progress Notes (Signed)
Pre visit review using our clinic review tool, if applicable. No additional management support is needed unless otherwise documented below in the visit note. 

## 2016-03-13 NOTE — Patient Instructions (Signed)
Complete lab work prior to leaving today. I will notify you of your results once received.   It's importance to improve your diet by reducing consumption of fast food, fried food, processed snack foods, sugary drinks. Increase consumption of fresh vegetables and fruits, whole grains, water.  Ensure you are drinking 64 ounces of water daily.  Continue exercising. You should be getting 150 minutes of moderate intensity exercise weekly.  Drop off your form up front and I will complete this as soon as possible.  Follow up in 1 year for repeat physical or sooner if needed.  It was a pleasure to meet you today! Please don't hesitate to call me with any questions. Welcome back to [facility]!

## 2016-03-14 ENCOUNTER — Encounter: Payer: Self-pay | Admitting: *Deleted

## 2016-03-14 LAB — LIPID PANEL
Cholesterol: 148 mg/dL (ref 0–200)
HDL: 62.8 mg/dL (ref >39.00)
LDL Cholesterol: 52 mg/dL (ref 0–99)
NONHDL: 85.1
Total CHOL/HDL Ratio: 2
Triglycerides: 168 mg/dL — ABNORMAL HIGH (ref 0.0–149.0)
VLDL: 33.6 mg/dL (ref 0.0–40.0)

## 2016-03-14 LAB — COMPREHENSIVE METABOLIC PANEL
ALK PHOS: 53 U/L (ref 39–117)
ALT: 13 U/L (ref 0–53)
AST: 20 U/L (ref 0–37)
Albumin: 4.6 g/dL (ref 3.5–5.2)
BUN: 13 mg/dL (ref 6–23)
CO2: 29 mEq/L (ref 19–32)
CREATININE: 0.95 mg/dL (ref 0.40–1.50)
Calcium: 10 mg/dL (ref 8.4–10.5)
Chloride: 101 mEq/L (ref 96–112)
GFR: 98.16 mL/min (ref >60.00)
GLUCOSE: 91 mg/dL (ref 70–99)
POTASSIUM: 4.1 meq/L (ref 3.5–5.1)
SODIUM: 137 meq/L (ref 135–145)
TOTAL PROTEIN: 7.7 g/dL (ref 6.0–8.3)
Total Bilirubin: 0.6 mg/dL (ref 0.2–1.2)

## 2016-04-03 ENCOUNTER — Telehealth: Payer: Self-pay | Admitting: *Deleted

## 2016-04-03 NOTE — Telephone Encounter (Signed)
PT came in to bring physical form in. Please call him when it is complete [phone]. Form placed in prescription tower.

## 2016-04-03 NOTE — Telephone Encounter (Signed)
Completed and placed in Chans inbox. 

## 2016-04-03 NOTE — Telephone Encounter (Signed)
Placed form in Kate's inbox 

## 2016-04-04 NOTE — Telephone Encounter (Signed)
Tried to call patient but patient is unavailable and could not leave message.

## 2016-04-04 NOTE — Telephone Encounter (Signed)
Spoken to patient and notified him that form is ready for pick up. Left in the front office.

## 2017-04-21 DIAGNOSIS — S82402A Unspecified fracture of shaft of left fibula, initial encounter for closed fracture: Secondary | ICD-10-CM

## 2017-04-21 HISTORY — DX: Unspecified fracture of shaft of left fibula, initial encounter for closed fracture: S82.402A

## 2017-05-01 ENCOUNTER — Emergency Department (HOSPITAL_COMMUNITY)
Admission: EM | Admit: 2017-05-01 | Discharge: 2017-05-01 | Disposition: A | Discharge status: Home / Self Care | Payer: 59 | Attending: Emergency Medicine | Admitting: Emergency Medicine

## 2017-05-01 ENCOUNTER — Encounter (HOSPITAL_COMMUNITY): Payer: Self-pay | Admitting: Emergency Medicine

## 2017-05-01 ENCOUNTER — Other Ambulatory Visit: Payer: Self-pay

## 2017-05-01 ENCOUNTER — Emergency Department (HOSPITAL_COMMUNITY): Payer: 59

## 2017-05-01 DIAGNOSIS — W000XXA Fall on same level due to ice and snow, initial encounter: Secondary | ICD-10-CM | POA: Insufficient documentation

## 2017-05-01 DIAGNOSIS — F17210 Nicotine dependence, cigarettes, uncomplicated: Secondary | ICD-10-CM | POA: Diagnosis not present

## 2017-05-01 DIAGNOSIS — Y999 Unspecified external cause status: Secondary | ICD-10-CM | POA: Insufficient documentation

## 2017-05-01 DIAGNOSIS — S82892A Other fracture of left lower leg, initial encounter for closed fracture: Secondary | ICD-10-CM | POA: Insufficient documentation

## 2017-05-01 DIAGNOSIS — Y939 Activity, unspecified: Secondary | ICD-10-CM | POA: Insufficient documentation

## 2017-05-01 DIAGNOSIS — S82832A Other fracture of upper and lower end of left fibula, initial encounter for closed fracture: Secondary | ICD-10-CM

## 2017-05-01 DIAGNOSIS — Y929 Unspecified place or not applicable: Secondary | ICD-10-CM | POA: Diagnosis not present

## 2017-05-01 DIAGNOSIS — S99912A Unspecified injury of left ankle, initial encounter: Secondary | ICD-10-CM | POA: Diagnosis present

## 2017-05-01 MED ORDER — OXYCODONE-ACETAMINOPHEN 5-325 MG PO TABS
1.0000 | ORAL_TABLET | Freq: Once | ORAL | Status: AC
  Administered 2017-05-01: 1 via ORAL
  Filled 2017-05-01: qty 1

## 2017-05-01 MED ORDER — OXYCODONE-ACETAMINOPHEN 5-325 MG PO TABS: 1.0000 | ORAL_TABLET | ORAL | 0 refills | Status: DC | PRN

## 2017-05-01 MED ORDER — IBUPROFEN 600 MG PO TABS: 600.0000 mg | ORAL_TABLET | Freq: Four times a day (QID) | ORAL | 0 refills | Status: DC | PRN

## 2017-05-01 NOTE — ED Triage Notes (Signed)
Patient reports slipping on ice and injuring his left ankle. Pt has good sensation to toes. Unable to lower and raise ankle. Ice applied this morning but states made it feel worse. Took 2 ibuprofen PTA.

## 2017-05-01 NOTE — ED Provider Notes (Signed)
[facility]-EMERGENCY DEPT Provider Note   CSN: [id number] Arrival date & time: 05/01/17  1244     History   Chief Complaint Chief Complaint  Patient presents with  . Ankle Injury    HPI  Wende Neighborsony A Kuehnle Jr. is a 31 y.o. male who presents with left ankle pain.  No significant past medical history.  The patient states that he slipped on ice earlier this morning while going to work and twisted his ankle.  He has been unable to ambulate since the accident.  He has applied ice and taken some medicine for pain however this has not provided much relief.  He denies any numbness, tingling.  He also is having some pain over the proximal calf. He works in Aeronautical engineerlandscaping.    HPI  Past Medical History:  Diagnosis Date  . History of chicken pox   . Kidney stones 2000    Patient Active Problem List   Diagnosis Date Noted  . Preventative health care 05/03/2012  . Smoking 05/03/2012    Past Surgical History:  Procedure Laterality Date  . APPENDECTOMY  2009       Home Medications    Prior to Admission medications   Not on File    Family History Family History  Problem Relation Age of Onset  . Alcohol abuse Maternal Uncle   . Seizures Maternal Uncle   . CAD Neg Hx   . Stroke Neg Hx   . Diabetes Neg Hx   . Cancer Neg Hx     Social History Social History   Tobacco Use  . Smoking status: Current Every Day Smoker    Packs/day: 0.50    Years: 12.00    Pack years: 6.00    Types: Cigarettes  . Smokeless tobacco: Never Used  Substance Use Topics  . Alcohol use: Yes    Comment: social  . Drug use: Yes    Frequency: 2.0 times per week    Types: Marijuana    Comment: MJ     Allergies   Patient has no known allergies.   Review of Systems Review of Systems  Musculoskeletal: Positive for arthralgias, gait problem and joint swelling.  Skin: Negative for wound.  Neurological: Negative for weakness and numbness.     Physical Exam Updated  Vital Signs BP 126/74   Pulse (!) 57   Temp 98 F (36.7 C) (Oral)   Resp 16   Ht 5\' 10"  (1.778 m)   Wt 65.8 kg (145 lb)   SpO2 95%   BMI 20.81 kg/m   Physical Exam  Constitutional: He is oriented to person, place, and time. He appears well-developed and well-nourished. No distress.  HENT:  Head: Normocephalic and atraumatic.  Eyes: Conjunctivae are normal. Pupils are equal, round, and reactive to light. Right eye exhibits no discharge. Left eye exhibits no discharge. No scleral icterus.  Neck: Normal range of motion.  Cardiovascular: Normal rate.  Pulmonary/Chest: Effort normal. No respiratory distress.  Abdominal: He exhibits no distension.  Musculoskeletal:  Left leg: Large amount of swelling of the ankle. Tenderness to palpation of ankle and mild tenderness over proximal calf. No tenderness of foot or toes. Able to wiggle toes. N/V intact.   Neurological: He is alert and oriented to person, place, and time.  Skin: Skin is warm and dry.  Psychiatric: He has a normal mood and affect. His behavior is normal.  Nursing note and vitals reviewed.    ED Treatments / Results  Labs (all  labs ordered are listed, but only abnormal results are displayed) Labs Reviewed - No data to display  EKG  EKG Interpretation None       Radiology Dg Tibia/fibula Left  Result Date: 05/01/2017 CLINICAL DATA:  Knee and left tibia and fibular pain after fall today. EXAM: LEFT TIBIA AND FIBULA - 2 VIEW COMPARISON:  None. FINDINGS: There is soft tissue swelling over the lateral malleolus with an intra-articular, predominantly transverse fracture of the distal fibular metaphysis extending into the ankle mortise. No widening of the ankle mortise is noted. Slight lateral displacement of the distal fibular fracture fragment. No additional acute osseous abnormality is seen of the tibia and fibula. Joint spaces are maintained at the knee and ankle. IMPRESSION: Acute, closed, intra-articular fracture of  the distal fibular metaphysis with slight lateral displacement of the distal fracture fragment. Electronically Signed   By: Tollie Ethavid  Kwon M.D.   On: 05/01/2017 17:47   Dg Ankle Complete Left  Result Date: 05/01/2017 CLINICAL DATA:  Slipped and twisted the left ankle while getting into the car this morning. Persistent lateral pain and swelling. EXAM: LEFT ANKLE COMPLETE - 3+ VIEW COMPARISON:  None in PACs FINDINGS: The patient has sustained an acute mildly displaced fracture of the distal left fibular metaphysis. The adjacent medial and posterior malleoli are intact. The ankle joint mortise is preserved. The talar dome and body of the talus and calcaneus are intact. The metatarsal bases are intact where visualized. There is mild soft tissue swelling anteriorly and laterally. IMPRESSION: The patient has sustained an acute mildly displaced transversely oriented fracture of the distal left fibular metaphysis. Electronically Signed   By: David  SwazilandJordan M.D.   On: 05/01/2017 14:02    Procedures Procedures (including critical care time)  Medications Ordered in ED Medications  oxyCODONE-acetaminophen (PERCOCET/ROXICET) 5-325 MG per tablet 1 tablet (1 tablet Oral Given 05/01/17 1711)     Initial Impression / Assessment and Plan / ED Course  I have reviewed the triage vital signs and the nursing notes.  Pertinent labs & imaging results that were available during my care of the patient were reviewed by me and considered in my medical decision making (see chart for details).  31 year old male presents with a distal fibula fracture after slipping on the ice earlier today.  He does have some proximal tenderness as well therefore x-ray of the tibia and fibula were ordered which was negative.  Spoke with Dr. Linna CapriceSwinteck with orthopedics who advises a posterior splint with u strap, crutches, follow-up in the office early next week.  Patient was advised to not to put any weight on the left leg.  He works in  Aeronautical engineerlandscaping therefore a work note was given until next week.  He is advised to ice and elevate the leg as much as possible.  Prescription for pain medication and NSAIDs were given.  Return precautions were given.  Final Clinical Impressions(s) / ED Diagnoses   Final diagnoses:  Closed fracture of distal end of left fibula, unspecified fracture morphology, initial encounter    ED Discharge Orders    None       Bethel BornGekas, Juanell Saffo Marie, PA-C 05/01/17 1903    Jacalyn LefevreHaviland, Julie, MD 05/01/17 2208

## 2017-05-01 NOTE — Discharge Instructions (Signed)
Rest - do not put any weight on the left leg Ice - ice for 20 minutes at a time, several times a day Elevate - elevate ankle above level of heart Ibuprofen - take with food. Take up to 3-4 times daily Take pain medicine as needed for severe pain Call Dr. Kathline MagicSwinteck's office tomorrow for appointment on Monday or Tuesday

## 2017-06-01 ENCOUNTER — Encounter: Payer: Self-pay | Admitting: Family Medicine

## 2017-07-02 ENCOUNTER — Encounter: Payer: Self-pay | Admitting: Family Medicine

## 2017-08-08 ENCOUNTER — Encounter: Payer: Self-pay | Admitting: Family Medicine

## 2017-08-08 DIAGNOSIS — S82402A Unspecified fracture of shaft of left fibula, initial encounter for closed fracture: Secondary | ICD-10-CM | POA: Insufficient documentation

## 2017-10-29 ENCOUNTER — Encounter (HOSPITAL_COMMUNITY): Payer: Self-pay | Admitting: Emergency Medicine

## 2017-10-29 ENCOUNTER — Ambulatory Visit (HOSPITAL_COMMUNITY)
Admission: EM | Admit: 2017-10-29 | Discharge: 2017-10-29 | Disposition: A | Discharge status: Home / Self Care | Payer: 59 | Attending: Family Medicine | Admitting: Family Medicine

## 2017-10-29 DIAGNOSIS — K029 Dental caries, unspecified: Secondary | ICD-10-CM

## 2017-10-29 DIAGNOSIS — K047 Periapical abscess without sinus: Secondary | ICD-10-CM

## 2017-10-29 MED ORDER — HYDROCODONE-ACETAMINOPHEN 7.5-325 MG PO TABS: 1.0000 | ORAL_TABLET | ORAL | 0 refills | Status: DC | PRN

## 2017-10-29 MED ORDER — PENICILLIN V POTASSIUM 500 MG PO TABS: 500.0000 mg | ORAL_TABLET | Freq: Four times a day (QID) | ORAL | 1 refills | Status: AC

## 2017-10-29 NOTE — Discharge Instructions (Addendum)
Take the antibiotic 4 times a day. Take the pain medicine as needed. Pain medicine can cause drowsiness, do not drive Note for work today Follow-up with a Education officer, communitydentist

## 2017-10-29 NOTE — ED Triage Notes (Signed)
Pt sts dental pain 

## 2017-10-29 NOTE — ED Provider Notes (Signed)
[facility]    CSN: [id number] Arrival date & time: 10/29/17  1243     History   Chief Complaint Chief Complaint  Patient presents with  . Dental Pain    HPI Phillip Neighborsony A Vick Jr. is a 32 y.o. male.   HPI  Patient with known dental fractures and poor dental condition, over the weekend developed pain in his left upper jaw.  Today his face is swollen.  Is very painful.  He can hardly open his mouth.  He is having difficulty chewing.  He would like a referral to a dentist.  No fever or chills.  No difficulty swallowing.  Past Medical History:  Diagnosis Date  . Closed fracture of left fibula 04/2017   [place]  . History of chicken pox   . Kidney stones 2000    Patient Active Problem List   Diagnosis Date Noted  . Closed left fibular fracture 08/08/2017  . Preventative health care 05/03/2012  . Smoking 05/03/2012    Past Surgical History:  Procedure Laterality Date  . APPENDECTOMY  2009       Home Medications    Prior to Admission medications   Medication Sig Start Date End Date Taking? Authorizing Provider  HYDROcodone-acetaminophen (NORCO) 7.5-325 MG tablet Take 1 tablet by mouth every 4 (four) hours as needed for moderate pain. 10/29/17   Eustace MooreNelson, Lolitha Tortora Sue, MD  ibuprofen (ADVIL,MOTRIN) 600 MG tablet Take 1 tablet (600 mg total) by mouth every 6 (six) hours as needed. 05/01/17   Bethel BornGekas, Kelly Marie, PA-C  penicillin v potassium (VEETID) 500 MG tablet Take 1 tablet (500 mg total) by mouth 4 (four) times daily for 7 days. 10/29/17 11/05/17  Eustace MooreNelson, Aidenjames Heckmann Sue, MD    Family History Family History  Problem Relation Age of Onset  . Alcohol abuse Maternal Uncle   . Seizures Maternal Uncle   . CAD Neg Hx   . Stroke Neg Hx   . Diabetes Neg Hx   . Cancer Neg Hx     Social History Social History   Tobacco Use  . Smoking status: Current Every Day Smoker    Packs/day: 0.50    Years: 12.00    Pack years: 6.00    Types: Cigarettes  . Smokeless tobacco:  Never Used  Substance Use Topics  . Alcohol use: Yes    Comment: social  . Drug use: Yes    Frequency: 2.0 times per week    Types: Marijuana    Comment: MJ     Allergies   Patient has no known allergies.   Review of Systems Review of Systems  Constitutional: Negative for chills and fever.  HENT: Positive for dental problem. Negative for ear pain and sore throat.   Eyes: Negative for pain and visual disturbance.  Respiratory: Negative for cough and shortness of breath.   Cardiovascular: Negative for chest pain and palpitations.  Gastrointestinal: Negative for abdominal pain and vomiting.  Genitourinary: Negative for dysuria and hematuria.  Musculoskeletal: Negative for arthralgias and back pain.  Skin: Negative for color change and rash.  Neurological: Negative for seizures and syncope.  All other systems reviewed and are negative.    Physical Exam Triage Vital Signs ED Triage Vitals [10/29/17 1407]  Enc Vitals Group     BP 112/70     Pulse Rate (!) 52     Resp 18     Temp (!) 97.4 F (36.3 C)     Temp Source Oral  SpO2 100 %     Weight      Height      Head Circumference      Peak Flow      Pain Score      Pain Loc      Pain Edu?      Excl. in GC?    No data found.  Updated Vital Signs BP 112/70 (BP Location: Left Arm)   Pulse (!) 52   Temp (!) 97.4 F (36.3 C) (Oral)   Resp 18   SpO2 100%       Physical Exam  Constitutional: He appears well-developed and well-nourished. No distress.  HENT:  Head: Normocephalic and atraumatic.  Right Ear: External ear normal.  Left Ear: External ear normal.  Mouth/Throat: Oropharynx is clear and moist.    Extensive periodontal disease, fractures, and decay on both the upper and lower jaw on the left.  Erythema and swelling of the gum line is noted.  Eyes: Pupils are equal, round, and reactive to light. Conjunctivae are normal.  Neck: Normal range of motion.  Cardiovascular: Normal rate.  Pulmonary/Chest:  Effort normal. No respiratory distress.  Abdominal: Soft. He exhibits no distension.  Musculoskeletal: Normal range of motion. He exhibits no edema.  Lymphadenopathy:    He has cervical adenopathy.  Neurological: He is alert.  Skin: Skin is warm and dry.     UC Treatments / Results  Labs (all labs ordered are listed, but only abnormal results are displayed) Labs Reviewed - No data to display  EKG None  Radiology No results found.  Procedures Procedures (including critical care time)  Medications Ordered in UC Medications - No data to display  Initial Impression / Assessment and Plan / UC Course  I have reviewed the triage vital signs and the nursing notes.  Pertinent labs & imaging results that were available during my care of the patient were reviewed by me and considered in my medical decision making (see chart for details).      Final Clinical Impressions(s) / UC Diagnoses   Final diagnoses:  Dental caries  Dental infection     Discharge Instructions     Take the antibiotic 4 times a day. Take the pain medicine as needed. Pain medicine can cause drowsiness, do not drive Note for work today Follow-up with a dentist   ED Prescriptions    Medication Sig Dispense Auth. Provider   penicillin v potassium (VEETID) 500 MG tablet Take 1 tablet (500 mg total) by mouth 4 (four) times daily for 7 days. 28 tablet Eustace Moore, MD   HYDROcodone-acetaminophen [facility]) 7.5-325 MG tablet Take 1 tablet by mouth every 4 (four) hours as needed for moderate pain. 10 tablet Eustace Moore, MD     Controlled Substance Prescriptions [place] Controlled Substance Registry consulted? Not Applicable   Eustace Moore, MD 10/29/17 1432

## 2018-04-16 ENCOUNTER — Other Ambulatory Visit: Payer: Self-pay | Admitting: Primary Care

## 2018-04-16 DIAGNOSIS — Z0000 Encounter for general adult medical examination without abnormal findings: Secondary | ICD-10-CM

## 2018-04-23 ENCOUNTER — Other Ambulatory Visit (INDEPENDENT_AMBULATORY_CARE_PROVIDER_SITE_OTHER): Payer: 59

## 2018-04-23 DIAGNOSIS — Z0000 Encounter for general adult medical examination without abnormal findings: Secondary | ICD-10-CM

## 2018-04-23 LAB — LIPID PANEL
Cholesterol: 114 mg/dL (ref 0–200)
HDL: 48.4 mg/dL (ref >39.00)
LDL Cholesterol: 56 mg/dL (ref 0–99)
NonHDL: 65.73
TRIGLYCERIDES: 47 mg/dL (ref 0.0–149.0)
Total CHOL/HDL Ratio: 2
VLDL: 9.4 mg/dL (ref 0.0–40.0)

## 2018-04-23 LAB — COMPREHENSIVE METABOLIC PANEL
ALK PHOS: 55 U/L (ref 39–117)
ALT: 15 U/L (ref 0–53)
AST: 20 U/L (ref 0–37)
Albumin: 4.3 g/dL (ref 3.5–5.2)
BILIRUBIN TOTAL: 1.1 mg/dL (ref 0.2–1.2)
BUN: 13 mg/dL (ref 6–23)
CALCIUM: 9.7 mg/dL (ref 8.4–10.5)
CO2: 29 meq/L (ref 19–32)
Chloride: 103 mEq/L (ref 96–112)
Creatinine, Ser: 0.86 mg/dL (ref 0.40–1.50)
GFR: 108.66 mL/min (ref >60.00)
Glucose, Bld: 87 mg/dL (ref 70–99)
POTASSIUM: 4 meq/L (ref 3.5–5.1)
Sodium: 137 mEq/L (ref 135–145)
TOTAL PROTEIN: 7.5 g/dL (ref 6.0–8.3)

## 2018-04-30 ENCOUNTER — Encounter: Payer: 59 | Admitting: Primary Care

## 2018-04-30 ENCOUNTER — Encounter

## 2018-05-29 ENCOUNTER — Encounter: Payer: 59 | Admitting: Primary Care

## 2018-05-29 DIAGNOSIS — Z0289 Encounter for other administrative examinations: Secondary | ICD-10-CM

## 2018-11-17 IMAGING — CR DG ANKLE COMPLETE 3+V*L*
3 series · 3 of 3 positions shown · non-contrast
Comparison: None in PACs

CLINICAL DATA: Slipped and twisted the left ankle while getting
into the car this morning. Persistent lateral pain and swelling.

EXAM:
LEFT ANKLE COMPLETE - 3+ VIEW

[x ankle ap left]
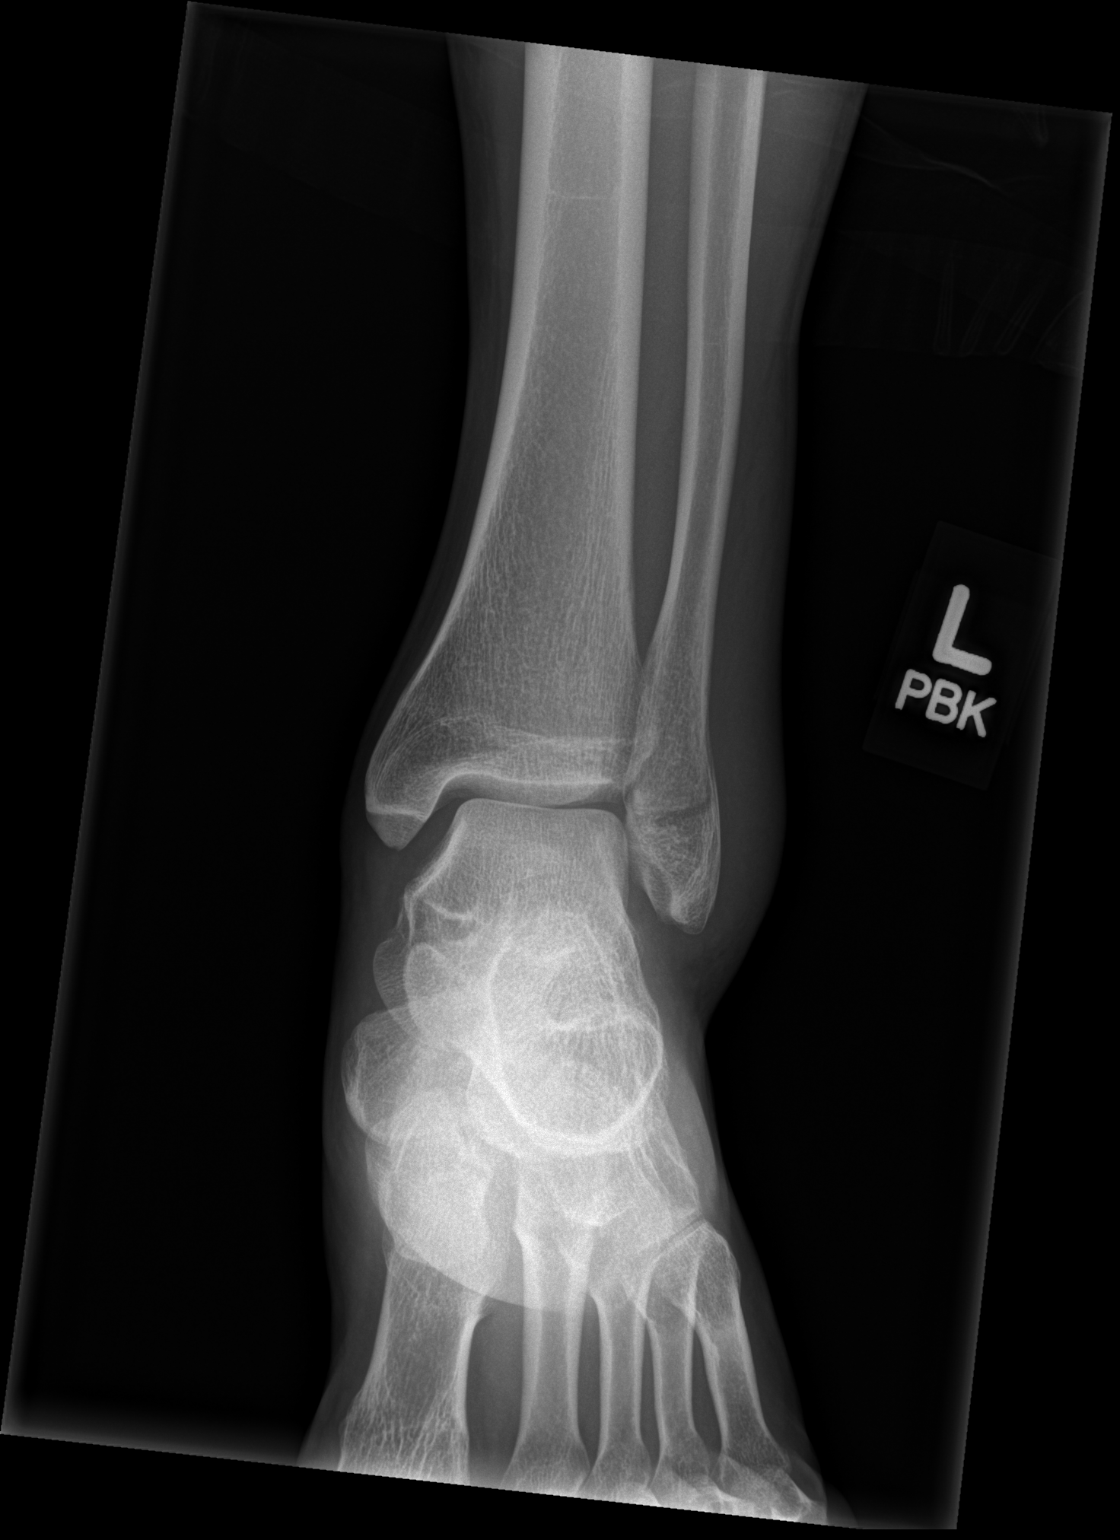

[x ankle obl left]
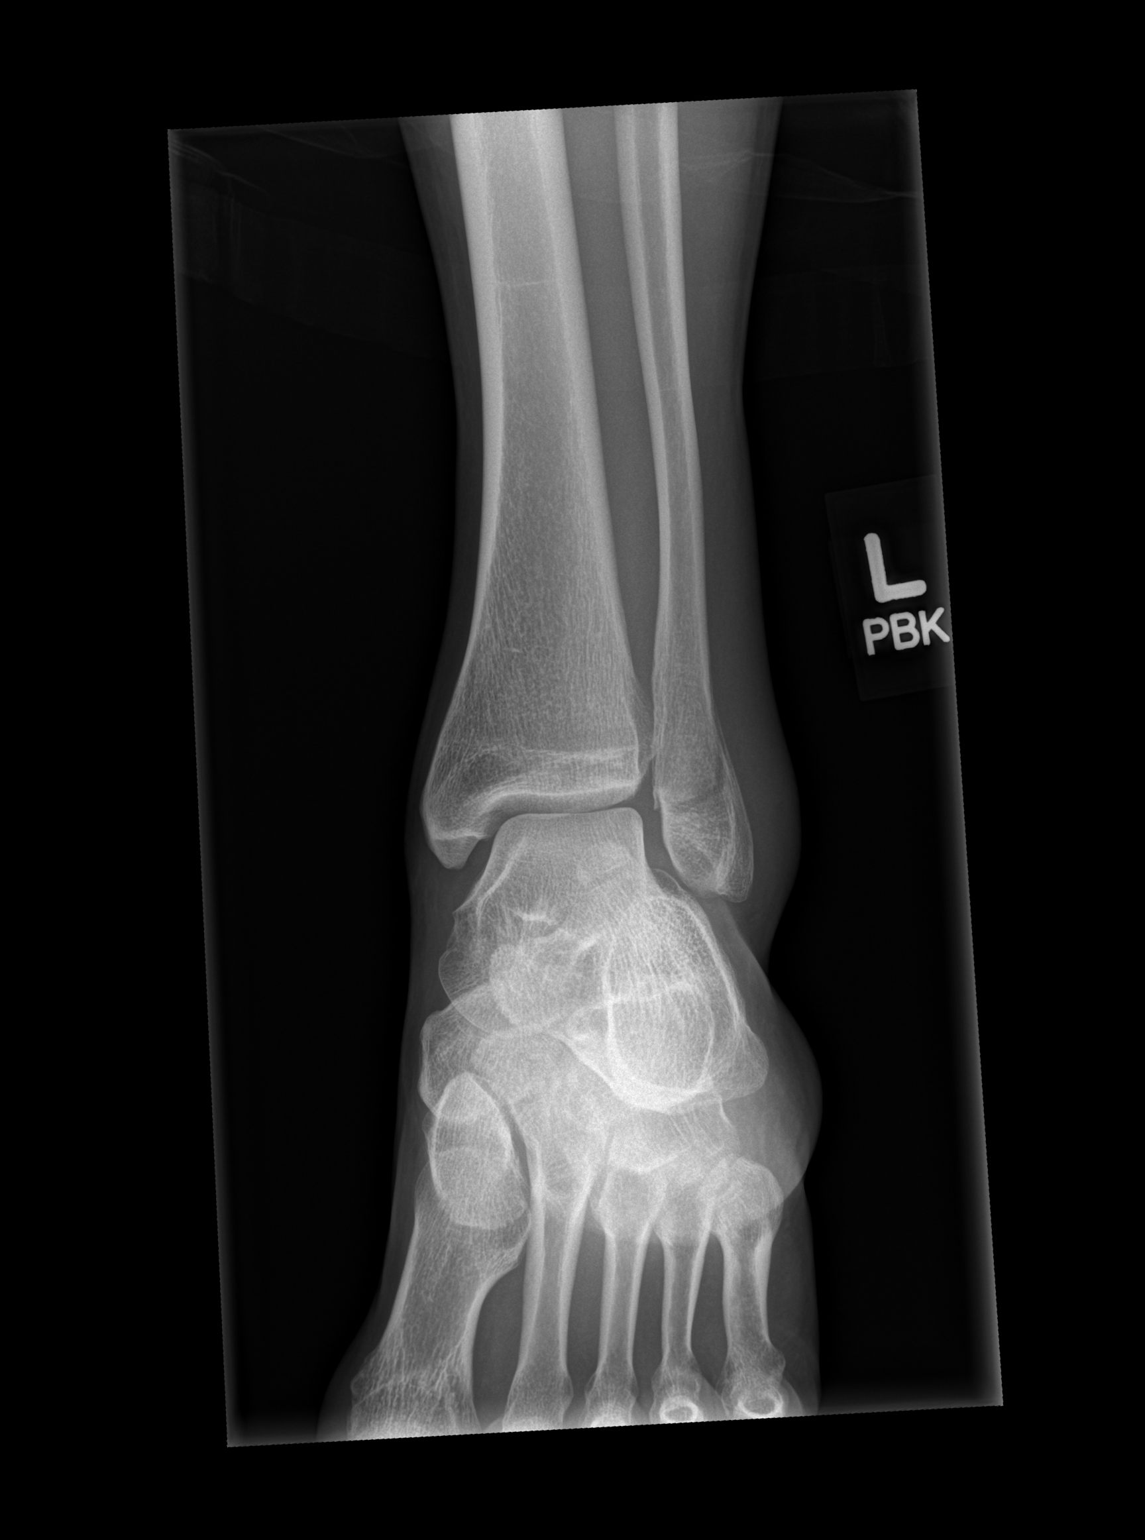

[x ankle lat left]
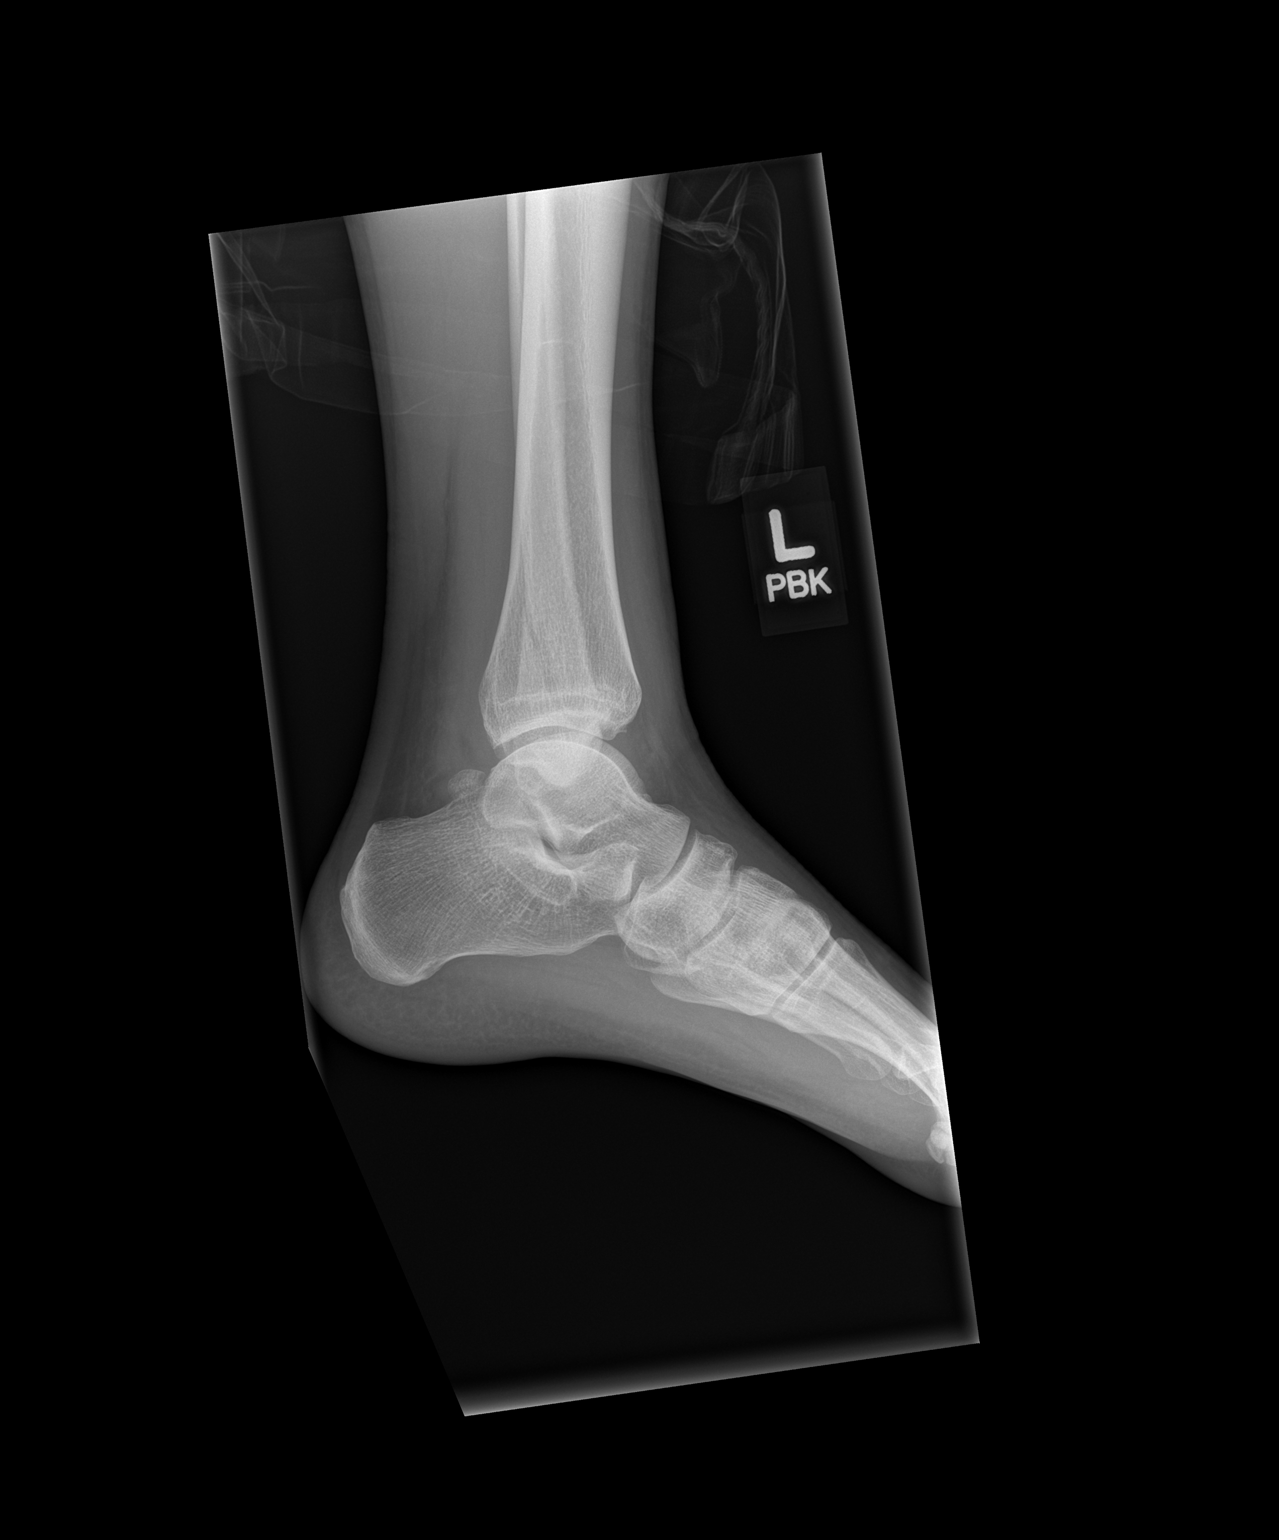

[3 of 3 positions shown; findings below may reference images not displayed]

FINDINGS: The patient has sustained an acute mildly displaced fracture of the
distal left fibular metaphysis. The adjacent medial and posterior
malleoli are intact. The ankle joint mortise is preserved. The talar
dome and body of the talus and calcaneus are intact. The metatarsal
bases are intact where visualized. There is mild soft tissue
swelling anteriorly and laterally.
IMPRESSION: The patient has sustained an acute mildly displaced transversely
oriented fracture of the distal left fibular metaphysis.

## 2019-09-20 ENCOUNTER — Ambulatory Visit: Payer: Self-pay | Attending: Oncology

## 2019-09-20 DIAGNOSIS — Z23 Encounter for immunization: Secondary | ICD-10-CM

## 2019-09-20 NOTE — Progress Notes (Signed)
   [id number]  Name:  Phillip Randolph.    MRN: [id number] DOB: 06/08/1985  09/20/2019  [name] was observed post Covid-19 immunization for 15 minutes without incident. He was provided with Vaccine Information Sheet and instruction to access the V-Safe system.   [name] was instructed to call 911 with any severe reactions post vaccine: Marland Kitchen Difficulty breathing  . Swelling of face and throat  . A fast heartbeat  . A bad rash all over body  . Dizziness and weakness   Immunizations Administered    Name Date Dose VIS Date Route   Pfizer COVID-19 Vaccine 09/20/2019 11:04 AM 0.3 mL 07/16/2018 Intramuscular   Manufacturer: [employer], [employer]   Lot: [id number]   NDC: [address]-6

## 2019-09-30 ENCOUNTER — Ambulatory Visit
Admission: EM | Admit: 2019-09-30 | Discharge: 2019-09-30 | Disposition: A | Discharge status: Home / Self Care | Payer: BC Managed Care – PPO | Attending: Emergency Medicine | Admitting: Emergency Medicine

## 2019-09-30 ENCOUNTER — Other Ambulatory Visit: Payer: Self-pay

## 2019-09-30 ENCOUNTER — Encounter: Payer: Self-pay | Admitting: Emergency Medicine

## 2019-09-30 DIAGNOSIS — K029 Dental caries, unspecified: Secondary | ICD-10-CM

## 2019-09-30 MED ORDER — PENICILLIN V POTASSIUM 500 MG PO TABS: 500.0000 mg | ORAL_TABLET | Freq: Four times a day (QID) | ORAL | 0 refills | Status: AC

## 2019-09-30 MED ORDER — TRAMADOL HCL 50 MG PO TABS: 50.0000 mg | ORAL_TABLET | Freq: Four times a day (QID) | ORAL | 0 refills | Status: AC | PRN

## 2019-09-30 MED ORDER — IBUPROFEN 600 MG PO TABS: 600.0000 mg | ORAL_TABLET | Freq: Four times a day (QID) | ORAL | 0 refills | Status: DC | PRN

## 2019-09-30 NOTE — Discharge Instructions (Addendum)
Take the antibiotic as prescribed.  Take the prescribed ibuprofen as needed for mild to moderate pain.  Take the prescribed tramadol as needed for moderate to severe pain; do not drive, operate machinery, or drink alcohol with this medication as it will cause drowsiness.  A dental resource guide is attached.  Please call to make an appointment with a dentist as soon as possible.    Return here or go to the emergency department if you develop increased pain, swelling, fever, chills, or other concerning symptoms.

## 2019-09-30 NOTE — ED Triage Notes (Signed)
Patient in today c/o dental pain x 3 days, getting worse. Patient has taken Ibuprofen and "migraine pills" for the pain.

## 2019-09-30 NOTE — ED Provider Notes (Signed)
[facility]    CSN: [id number] Arrival date & time: 09/30/19  0900      History   Chief Complaint Chief Complaint  Patient presents with  . Dental Pain    HPI Phillip Randolph. is a 34 y.o. male.   Patient presents with a tooth ache on his left lower side x 3 days.  He has had pain in the same tooth in the past but has not followed up with a dentist; he states he was told he would need to have this removed under general anesthesia.  He has attempted treatment with ibuprofen.  He denies fever, chills, difficulty swallowing, facial swelling, or other symptoms.  The history is provided by the patient.    Past Medical History:  Diagnosis Date  . Closed fracture of left fibula 04/2017   [place]  . History of chicken pox   . Kidney stones 2000    Patient Active Problem List   Diagnosis Date Noted  . Closed left fibular fracture 08/08/2017  . Preventative health care 05/03/2012  . Smoking 05/03/2012    Past Surgical History:  Procedure Laterality Date  . APPENDECTOMY  2009       Home Medications    Prior to Admission medications   Medication Sig Start Date End Date Taking? Authorizing Provider  ibuprofen (ADVIL) 600 MG tablet Take 1 tablet (600 mg total) by mouth every 6 (six) hours as needed. 09/30/19   Mickie Bail, NP  penicillin v potassium (VEETID) 500 MG tablet Take 1 tablet (500 mg total) by mouth 4 (four) times daily for 7 days. 09/30/19 10/07/19  Mickie Bail, NP  traMADol (ULTRAM) 50 MG tablet Take 1 tablet (50 mg total) by mouth every 6 (six) hours as needed. 09/30/19   Mickie Bail, NP    Family History Family History  Problem Relation Age of Onset  . Alcohol abuse Maternal Uncle   . Seizures Maternal Uncle   . Hypertension Mother   . Heart attack Father   . CAD Neg Hx   . Stroke Neg Hx   . Diabetes Neg Hx   . Cancer Neg Hx     Social History Social History   Tobacco Use  . Smoking status: Current Every Day Smoker    Packs/day:  0.50    Years: 12.00    Pack years: 6.00    Types: Cigarettes  . Smokeless tobacco: Never Used  Substance Use Topics  . Alcohol use: Yes    Comment: social  . Drug use: Yes    Frequency: 2.0 times per week    Types: Marijuana    Comment: MJ     Allergies   Patient has no known allergies.   Review of Systems Review of Systems  Constitutional: Negative for chills and fever.  HENT: Positive for dental problem. Negative for ear pain, facial swelling and sore throat.   Eyes: Negative for pain and visual disturbance.  Respiratory: Negative for cough and shortness of breath.   Cardiovascular: Negative for chest pain and palpitations.  Gastrointestinal: Negative for abdominal pain and vomiting.  Genitourinary: Negative for dysuria and hematuria.  Musculoskeletal: Negative for arthralgias and back pain.  Skin: Negative for color change and rash.  Neurological: Negative for seizures and syncope.  All other systems reviewed and are negative.    Physical Exam Triage Vital Signs ED Triage Vitals  Enc Vitals Group     BP 09/30/19 0903 133/86     Pulse  Rate 09/30/19 0903 64     Resp 09/30/19 0903 18     Temp 09/30/19 0903 97.8 F (36.6 C)     Temp Source 09/30/19 0903 Oral     SpO2 09/30/19 0903 98 %     Weight 09/30/19 0904 145 lb (65.8 kg)     Height 09/30/19 0904 5\' 10"  (1.778 m)     Head Circumference --      Peak Flow --      Pain Score 09/30/19 0903 8     Pain Loc --      Pain Edu? --      Excl. in GC? --    No data found.  Updated Vital Signs BP 133/86 (BP Location: Left Arm)   Pulse 64   Temp 97.8 F (36.6 C) (Oral)   Resp 18   Ht 5\' 10"  (1.778 m)   Wt 145 lb (65.8 kg)   SpO2 98%   BMI 20.81 kg/m   Visual Acuity Right Eye Distance:   Left Eye Distance:   Bilateral Distance:    Right Eye Near:   Left Eye Near:    Bilateral Near:     Physical Exam Vitals and nursing note reviewed.  Constitutional:      Appearance: He is well-developed.  HENT:      Head: Normocephalic and atraumatic.     Mouth/Throat:     Mouth: Mucous membranes are moist.     Dentition: Dental caries present.      Comments: Teeth in poor repair.  Numerous broken, carious, and missing teeth. Eyes:     Conjunctiva/sclera: Conjunctivae normal.  Cardiovascular:     Rate and Rhythm: Normal rate and regular rhythm.     Heart sounds: No murmur.  Pulmonary:     Effort: Pulmonary effort is normal. No respiratory distress.     Breath sounds: Normal breath sounds.  Abdominal:     Palpations: Abdomen is soft.     Tenderness: There is no abdominal tenderness.  Musculoskeletal:     Cervical back: Neck supple.  Skin:    General: Skin is warm and dry.     Findings: No rash.  Neurological:     General: No focal deficit present.     Mental Status: He is alert and oriented to person, place, and time.  Psychiatric:        Mood and Affect: Mood normal.        Behavior: Behavior normal.      UC Treatments / Results  Labs (all labs ordered are listed, but only abnormal results are displayed) Labs Reviewed - No data to display  EKG   Radiology No results found.  Procedures Procedures (including critical care time)  Medications Ordered in UC Medications - No data to display  Initial Impression / Assessment and Plan / UC Course  I have reviewed the triage vital signs and the nursing notes.  Pertinent labs & imaging results that were available during my care of the patient were reviewed by me and considered in my medical decision making (see chart for details).   Dental pain due to caries.  Treating with penicillin V.  Treating pain with ibuprofen and tramadol.  Precautions for drowsiness with tramadol discussed with patient.  Instructed him to schedule an appointment with a dentist as soon as possible; dental resource guide provided.  Instructed him to return here or go to the ER if he has increased pain, or develops difficulty swallowing, swelling, fever,  chills,  or other concerning symptoms.  Patient agrees to plan of care.   Final Clinical Impressions(s) / UC Diagnoses   Final diagnoses:  Pain due to dental caries     Discharge Instructions     Take the antibiotic as prescribed.  Take the prescribed ibuprofen as needed for mild to moderate pain.  Take the prescribed tramadol as needed for moderate to severe pain; do not drive, operate machinery, or drink alcohol with this medication as it will cause drowsiness.  A dental resource guide is attached.  Please call to make an appointment with a dentist as soon as possible.    Return here or go to the emergency department if you develop increased pain, swelling, fever, chills, or other concerning symptoms.        ED Prescriptions    Medication Sig Dispense Auth. Provider   penicillin v potassium (VEETID) 500 MG tablet Take 1 tablet (500 mg total) by mouth 4 (four) times daily for 7 days. 28 tablet Sharion Balloon, NP   traMADol (ULTRAM) 50 MG tablet Take 1 tablet (50 mg total) by mouth every 6 (six) hours as needed. 12 tablet Sharion Balloon, NP   ibuprofen (ADVIL) 600 MG tablet Take 1 tablet (600 mg total) by mouth every 6 (six) hours as needed. 30 tablet Sharion Balloon, NP     I have reviewed the PDMP during this encounter.   Sharion Balloon, NP 09/30/19 0930

## 2019-10-14 ENCOUNTER — Ambulatory Visit: Payer: BC Managed Care – PPO | Attending: Internal Medicine

## 2019-10-14 DIAGNOSIS — Z23 Encounter for immunization: Secondary | ICD-10-CM

## 2019-10-14 NOTE — Progress Notes (Signed)
   [id number]  Name:  Djon Tith.    MRN: [id number] DOB: 03-11-1986  10/14/2019  Mr. Wescoat was observed post Covid-19 immunization for 15 minutes without incident. He was provided with Vaccine Information Sheet and instruction to access the V-Safe system.   Mr. Guandique was instructed to call 911 with any severe reactions post vaccine: Marland Kitchen Difficulty breathing  . Swelling of face and throat  . A fast heartbeat  . A bad rash all over body  . Dizziness and weakness   Immunizations Administered    Name Date Dose VIS Date Route   Pfizer COVID-19 Vaccine 10/14/2019  3:34 PM 0.3 mL 07/16/2018 Intramuscular   Manufacturer: [employer], [employer]   Lot: [id number]   NDC: [address]-9

## 2019-11-10 ENCOUNTER — Other Ambulatory Visit: Payer: Self-pay

## 2019-11-10 ENCOUNTER — Encounter: Payer: Self-pay | Admitting: Emergency Medicine

## 2019-11-10 ENCOUNTER — Ambulatory Visit
Admission: EM | Admit: 2019-11-10 | Discharge: 2019-11-10 | Disposition: A | Discharge status: Home / Self Care | Payer: BC Managed Care – PPO | Attending: Emergency Medicine | Admitting: Emergency Medicine

## 2019-11-10 DIAGNOSIS — K047 Periapical abscess without sinus: Secondary | ICD-10-CM | POA: Diagnosis not present

## 2019-11-10 DIAGNOSIS — K029 Dental caries, unspecified: Secondary | ICD-10-CM | POA: Diagnosis not present

## 2019-11-10 MED ORDER — IBUPROFEN 800 MG PO TABS: 800.0000 mg | ORAL_TABLET | Freq: Three times a day (TID) | ORAL | 0 refills | Status: AC | PRN

## 2019-11-10 MED ORDER — CLINDAMYCIN HCL 300 MG PO CAPS: 300.0000 mg | ORAL_CAPSULE | Freq: Three times a day (TID) | ORAL | 0 refills | Status: AC

## 2019-11-10 NOTE — Discharge Instructions (Addendum)
Take the clindamycin as directed.  Take the prescribed ibuprofen as needed for pain.    A dental resource guide is attached.  Please call to make an appointment with a dentist as soon as possible.

## 2019-11-10 NOTE — ED Provider Notes (Signed)
Phillip Randolph    CSN: [id number] Arrival date & time: 11/10/19  1715      History   Chief Complaint Chief Complaint  Patient presents with  . Dental Pain    HPI Phillip Bogart. is a 34 y.o. male.   Patient presents with left lower tooth ache and swelling of his left lower jaw x2 days.  He has dental caries and was seen for the same tooth on 09/30/2019; treated with penicillin vk.  He denies fever or chills.  He denies difficulty swallowing or breathing.  He has not seen a dentist yet.   The history is provided by the patient.    Past Medical History:  Diagnosis Date  . Closed fracture of left fibula 04/2017   [place]  . History of chicken pox   . Kidney stones 2000    Patient Active Problem List   Diagnosis Date Noted  . Closed left fibular fracture 08/08/2017  . Preventative health care 05/03/2012  . Smoking 05/03/2012    Past Surgical History:  Procedure Laterality Date  . APPENDECTOMY  2009       Home Medications    Prior to Admission medications   Medication Sig Start Date End Date Taking? Authorizing Provider  clindamycin (CLEOCIN) 300 MG capsule Take 1 capsule (300 mg total) by mouth 3 (three) times daily. 11/10/19   Mickie Bail, NP  ibuprofen (ADVIL) 800 MG tablet Take 1 tablet (800 mg total) by mouth every 8 (eight) hours as needed. 11/10/19   Mickie Bail, NP  traMADol (ULTRAM) 50 MG tablet Take 1 tablet (50 mg total) by mouth every 6 (six) hours as needed. 09/30/19   Mickie Bail, NP    Family History Family History  Problem Relation Age of Onset  . Alcohol abuse Maternal Uncle   . Seizures Maternal Uncle   . Hypertension Mother   . Heart attack Father   . CAD Neg Hx   . Stroke Neg Hx   . Diabetes Neg Hx   . Cancer Neg Hx     Social History Social History   Tobacco Use  . Smoking status: Current Every Day Smoker    Packs/day: 0.50    Years: 12.00    Pack years: 6.00    Types: Cigarettes  . Smokeless tobacco: Never Used    Vaping Use  . Vaping Use: Never used  Substance Use Topics  . Alcohol use: Yes    Comment: social  . Drug use: Yes    Frequency: 2.0 times per week    Types: Marijuana    Comment: MJ     Allergies   Patient has no known allergies.   Review of Systems Review of Systems  Constitutional: Negative for chills and fever.  HENT: Positive for dental problem and facial swelling. Negative for ear pain, sore throat and trouble swallowing.   Eyes: Negative for pain and visual disturbance.  Respiratory: Negative for cough and shortness of breath.   Cardiovascular: Negative for chest pain and palpitations.  Gastrointestinal: Negative for abdominal pain and vomiting.  Genitourinary: Negative for dysuria and hematuria.  Musculoskeletal: Negative for arthralgias and back pain.  Skin: Negative for color change and rash.  Neurological: Negative for seizures and syncope.  All other systems reviewed and are negative.    Physical Exam Triage Vital Signs ED Triage Vitals  Enc Vitals Group     BP 11/10/19 1720 105/69     Pulse Rate 11/10/19 1720 93  Resp 11/10/19 1720 16     Temp 11/10/19 1720 99.4 F (37.4 C)     Temp Source 11/10/19 1720 Oral     SpO2 11/10/19 1720 94 %     Weight --      Height --      Head Circumference --      Peak Flow --      Pain Score 11/10/19 1718 7     Pain Loc --      Pain Edu? --      Excl. in [place]? --    No data found.  Updated Vital Signs BP 105/69 (BP Location: Left Arm)   Pulse 93   Temp 99.4 F (37.4 C) (Oral)   Resp 16   SpO2 94%   Visual Acuity Right Eye Distance:   Left Eye Distance:   Bilateral Distance:    Right Eye Near:   Left Eye Near:    Bilateral Near:     Physical Exam Vitals and nursing note reviewed.  Constitutional:      General: He is not in acute distress.    Appearance: He is well-developed.  HENT:     Head: Normocephalic and atraumatic.     Mouth/Throat:     Mouth: Mucous membranes are moist.      Dentition: Dental caries present.     Pharynx: Uvula midline. No uvula swelling.      Comments: Teeth in poor repair.  Mild swelling of left lower jaw. No difficulty swallowing.  Speech clear.  Eyes:     Conjunctiva/sclera: Conjunctivae normal.  Cardiovascular:     Rate and Rhythm: Normal rate and regular rhythm.     Heart sounds: No murmur heard.   Pulmonary:     Effort: Pulmonary effort is normal. No respiratory distress.     Breath sounds: Normal breath sounds.  Abdominal:     Palpations: Abdomen is soft.     Tenderness: There is no abdominal tenderness. There is no guarding or rebound.  Musculoskeletal:     Cervical back: Neck supple.  Skin:    General: Skin is warm and dry.  Neurological:     General: No focal deficit present.     Mental Status: He is alert and oriented to person, place, and time.     Gait: Gait normal.  Psychiatric:        Mood and Affect: Mood normal.        Behavior: Behavior normal.      UC Treatments / Results  Labs (all labs ordered are listed, but only abnormal results are displayed) Labs Reviewed - No data to display  EKG   Radiology No results found.  Procedures Procedures (including critical care time)  Medications Ordered in UC Medications - No data to display  Initial Impression / Assessment and Plan / UC Course  I have reviewed the triage vital signs and the nursing notes.  Pertinent labs & imaging results that were available during my care of the patient were reviewed by me and considered in my medical decision making (see chart for details).   Dental infection, pain due to dental caries.  Treating with clindamycin and ibuprofen.  Instructed patient to follow-up with a dentist as soon as possible; dental resource guide provided.  Patient agrees to plan of care.   Final Clinical Impressions(s) / UC Diagnoses   Final diagnoses:  Pain due to dental caries  Dental infection     Discharge Instructions     Take  the  clindamycin as directed.  Take the prescribed ibuprofen as needed for pain.    A dental resource guide is attached.  Please call to make an appointment with a dentist as soon as possible.        ED Prescriptions    Medication Sig Dispense Auth. Provider   clindamycin (CLEOCIN) 300 MG capsule Take 1 capsule (300 mg total) by mouth 3 (three) times daily. 21 capsule Wendee Beavers H, NP   ibuprofen (ADVIL) 800 MG tablet Take 1 tablet (800 mg total) by mouth every 8 (eight) hours as needed. 21 tablet Mickie Bail, NP     I have reviewed the PDMP during this encounter.   Mickie Bail, NP 11/10/19 [phone]

## 2019-11-10 NOTE — ED Triage Notes (Signed)
Pt c/o dental pain on the left side  x 2 days. Pt states he has not been taking any medication for the pain but he did drink alcohol earlier to "numb" the pain.

## 2020-12-02 ENCOUNTER — Ambulatory Visit
Admission: EM | Admit: 2020-12-02 | Discharge: 2020-12-02 | Disposition: A | Discharge status: Home / Self Care | Payer: BC Managed Care – PPO

## 2020-12-02 ENCOUNTER — Other Ambulatory Visit: Payer: Self-pay

## 2020-12-02 DIAGNOSIS — R599 Enlarged lymph nodes, unspecified: Secondary | ICD-10-CM | POA: Diagnosis not present

## 2020-12-02 NOTE — ED Triage Notes (Signed)
Pt presents with swollen lymph node on R neck.  Not ttp. Denies pain,fever,congestion, sore throat,etc.  Denies any illness like symptoms but states he was lifting heavy furniture yesterday and thinks he may have pulled a muscle.

## 2020-12-02 NOTE — ED Provider Notes (Signed)
CHIEF COMPLAINT:   Chief Complaint  Patient presents with   Lymphadenopathy     SUBJECTIVE/HPI:  HPI A very pleasant 35 y.o.Male presents today with lymph node swelling to the right side of his neck.  Patient states that he noticed the area today.  He does not report any recent illness, cough, sore throat, fever or chills.  He does report that he was lifting heavy furniture yesterday and was worried that he may have strained a muscle in his neck.  Patient does need a work note.   has a past medical history of Closed fracture of left fibula (04/2017), History of chicken pox, and Kidney stones (2000). ROS:  Review of Systems See Subjective/HPI Medications, Allergies and Problem List personally reviewed in Epic today OBJECTIVE:   Vitals:   12/02/20 1356  BP: 123/73  Pulse: (!) 59  Resp: 18  Temp: 98.1 F (36.7 C)  SpO2: 97%    Physical Exam   General: Appears well-developed and well-nourished. No acute distress.  HEENT Head: Normocephalic and atraumatic.  Ears: Hearing grossly intact, no drainage or visible deformity.  Nose: No nasal deviation or rhinorrhea.  Mouth/Throat: No stridor or tracheal deviation.  Eyes: Conjunctivae and EOM are normal. No eye drainage or scleral icterus bilaterally.  Neck: Normal range of motion, neck is supple.  Right anterior cervical lymph node palpated to 1 cm which is mobile, soft. Cardiovascular: Normal rate Pulm/Chest: No respiratory distress Neurological: Alert and oriented to person, place, and time.  Skin: Skin is warm and dry.  Psychiatric: Normal mood, affect, behavior, and thought content.   Vital signs and nursing note reviewed.   Patient stable and cooperative with examination.  LABS/X-RAYS/EKG/MEDS:   No results found for any visits on 12/02/20.  MEDICAL DECISION MAKING:   Patient presents with lymph node swelling to the right side of his neck.  Patient states that he noticed the area today.  He does not report any recent  illness, cough, sore throat, fever or chills.  He does report that he was lifting heavy furniture yesterday and was worried that he may have strained a muscle in his neck.  Patient does need a work note.  Chart review completed.  Lymph node swelling not concerning today for underlying ominous etiology or bacterial etiology.  Patient states that the area seems to have gotten smaller as the day has progressed, but just wanted to get the area checked out.  Advised about home treatment and care along with strict return precautions for worsening of symptoms.  Stable on discharge.  Work note provided.  Return as needed.  ASSESSMENT/PLAN:  1. Swelling of lymph node  No orders of the defined types were placed in this encounter.  Instructions about new medications and side effects provided.  Plan:   Discharge Instructions      Return to clinic with any worsening of symptoms to include worsening pain, redness, draining from the area. Apply warm compresses to the area which will help to decrease the size of the lymph node.      A copy of these instructions have been given to the patient or responsible adult who demonstrated the ability to learn, asked appropriate questions, and verbalized understanding of the plan of care.  There were no barriers to learning identified.   Amalia Greenhouse, FNP-C 12/02/20  This note was partially made with the aid of speech-to-text dictation; typographical errors are not intentional.    Amalia Greenhouse, FNP 12/02/20 1425

## 2020-12-02 NOTE — Discharge Instructions (Addendum)
Return to clinic with any worsening of symptoms to include worsening pain, redness, draining from the area. Apply warm compresses to the area which will help to decrease the size of the lymph node.
